# Patient Record
Sex: Female | Born: 1960 | Race: White | Hispanic: No | Marital: Single | Smoking: Former smoker
Health system: Southern US, Community
[De-identification: ages and names within clinical notes are randomized; demographics above are authoritative.]

## PROBLEM LIST (undated history)

## (undated) DIAGNOSIS — J439 Emphysema, unspecified: Secondary | ICD-10-CM

## (undated) DIAGNOSIS — D0511 Intraductal carcinoma in situ of right breast: Secondary | ICD-10-CM

## (undated) DIAGNOSIS — K5190 Ulcerative colitis, unspecified, without complications: Secondary | ICD-10-CM

## (undated) DIAGNOSIS — C50919 Malignant neoplasm of unspecified site of unspecified female breast: Secondary | ICD-10-CM

## (undated) DIAGNOSIS — M47816 Spondylosis without myelopathy or radiculopathy, lumbar region: Secondary | ICD-10-CM

## (undated) DIAGNOSIS — F419 Anxiety disorder, unspecified: Secondary | ICD-10-CM

## (undated) DIAGNOSIS — E039 Hypothyroidism, unspecified: Secondary | ICD-10-CM

## (undated) DIAGNOSIS — E7800 Pure hypercholesterolemia, unspecified: Secondary | ICD-10-CM

## (undated) DIAGNOSIS — F32A Depression, unspecified: Secondary | ICD-10-CM

## (undated) HISTORY — PX: SIMPLE MASTECTOMY: SHX6097

## (undated) HISTORY — DX: Emphysema, unspecified: J43.9

## (undated) HISTORY — DX: Spondylosis without myelopathy or radiculopathy, lumbar region: M47.816

## (undated) HISTORY — DX: Intraductal carcinoma in situ of right breast: D05.11

## (undated) HISTORY — PX: FOOT FOREIGN BODY REMOVAL: SUR1116

## (undated) HISTORY — DX: Pure hypercholesterolemia, unspecified: E78.00

## (undated) HISTORY — DX: Malignant neoplasm of unspecified site of unspecified female breast: C50.919

## (undated) HISTORY — PX: COLONOSCOPY: SHX174

## (undated) HISTORY — DX: Ulcerative colitis, unspecified, without complications: K51.90

## (undated) HISTORY — PX: FOOT FRACTURE SURGERY: SHX645

## (undated) HISTORY — PX: SIMPLE MASTECTOMY: SHX2409

---

## 1999-05-08 ENCOUNTER — Ambulatory Visit (HOSPITAL_BASED_OUTPATIENT_CLINIC_OR_DEPARTMENT_OTHER): Admission: RE | Admit: 1999-05-08 | Discharge: 1999-05-08 | Payer: Self-pay | Admitting: Orthopedic Surgery

## 2000-03-13 ENCOUNTER — Other Ambulatory Visit: Admission: RE | Admit: 2000-03-13 | Discharge: 2000-03-13 | Payer: Self-pay | Admitting: Emergency Medicine

## 2007-01-29 ENCOUNTER — Ambulatory Visit (HOSPITAL_COMMUNITY): Admission: RE | Admit: 2007-01-29 | Discharge: 2007-01-29 | Payer: Self-pay | Admitting: Gastroenterology

## 2007-02-12 ENCOUNTER — Encounter (HOSPITAL_COMMUNITY): Admission: RE | Admit: 2007-02-12 | Discharge: 2007-05-13 | Payer: Self-pay | Admitting: Gastroenterology

## 2008-01-13 ENCOUNTER — Ambulatory Visit (HOSPITAL_COMMUNITY): Admission: RE | Admit: 2008-01-13 | Discharge: 2008-01-13 | Payer: Self-pay | Admitting: Internal Medicine

## 2010-05-31 NOTE — Op Note (Signed)
[place]  Patient:    Carly Cook TIA                        MRN: [id number] Proc. Date: 05/08/99 Attending:  Josephine Cables., M.D.                           Operative Report  INDICATIONS:  The patient is a 50 year old female who dropped a knife lacerating her extensor hallicus longus to the great toe to the right foot and thought this represented enough of a disability certainly to justify irrigation and repair as an outpatient.  POSTOPERATIVE DIAGNOSIS:  Laceration of extensor hallucis longus tendon right foot.  PROCEDURE:  Repair of extensor hallucis longus tendon.  Irrigation and debridement.  SURGEON:  Josephine Cables., M.D.  ANESTHESIA:  General anesthesia.  TOURNIQUET TIME:  Approximately 30 minutes.  DESCRIPTION OF PROCEDURE:  Sterile prep and drape.  Initially a tourniquet was inflated for the exposure to allow the tendon ends to be reapproximated through the repair, the tourniquet was released.  It was inflated to 250 mmHg with more of  transverse incision directly over the EHL tendon and proximal to the metatarsal  phalangeal joint.  This was extended proximally and distally, moreso proximally to allow retrieval of the proximal end of the tendon.  The tendon cut was relatively transverse and clean.  It was repaired with a modifed Tagima suture 3-0 core suture with addition of a running 6-0 Prolene suture on the exterior of the tendon. Repaired the tendon nicely to a good resting level compared to the flexed position of the great toe preoperatively.  The wound was irrigated prior to this and subsequent to the repair, skin closure was effected with 4-0 nylon.  Marcaine without epinephrine infiltrated in the skin.  A lightly compressive sterile dressing and a short-leg cast applied with the foot at neutral to include the toes. DD:  05/08/99 TD:  05/08/99 Job: [id number]

## 2015-12-19 DIAGNOSIS — D0511 Intraductal carcinoma in situ of right breast: Secondary | ICD-10-CM | POA: Insufficient documentation

## 2015-12-19 DIAGNOSIS — Z170 Estrogen receptor positive status [ER+]: Secondary | ICD-10-CM | POA: Insufficient documentation

## 2017-03-06 ENCOUNTER — Encounter: Payer: Self-pay | Admitting: Gastroenterology

## 2017-03-13 ENCOUNTER — Other Ambulatory Visit (HOSPITAL_COMMUNITY): Payer: Self-pay | Admitting: *Deleted

## 2017-03-16 ENCOUNTER — Encounter (HOSPITAL_COMMUNITY): Payer: Self-pay

## 2017-03-16 ENCOUNTER — Ambulatory Visit (HOSPITAL_COMMUNITY)
Admission: RE | Admit: 2017-03-16 | Discharge: 2017-03-16 | Disposition: A | Discharge status: Home / Self Care | Payer: BC Managed Care – PPO | Source: Ambulatory Visit | Attending: Internal Medicine | Admitting: Internal Medicine

## 2017-03-16 DIAGNOSIS — M810 Age-related osteoporosis without current pathological fracture: Secondary | ICD-10-CM | POA: Insufficient documentation

## 2017-03-16 MED ORDER — ZOLEDRONIC ACID 5 MG/100ML IV SOLN
5.0000 mg | Freq: Once | INTRAVENOUS | Status: AC
  Administered 2017-03-16: 5 mg via INTRAVENOUS

## 2017-03-16 MED ORDER — ZOLEDRONIC ACID 5 MG/100ML IV SOLN
INTRAVENOUS | Status: AC
  Administered 2017-03-16: 5 mg via INTRAVENOUS
  Filled 2017-03-16: qty 100

## 2017-03-31 ENCOUNTER — Telehealth: Payer: Self-pay | Admitting: Oncology

## 2017-03-31 NOTE — Telephone Encounter (Signed)
Appt has been scheduled for the pt to see Dr. Jana Hakim on 4/8 at 4pm. Pt has her records on a CD and will bring it in on Friday. Location given and my direct number was given to the pt.

## 2017-04-19 NOTE — Progress Notes (Addendum)
[place]  Telephone:([phone]) [phone] Fax:([phone]) [phone]     ID: Carly Cook DOB: 1960-11-29  MR#: [id number]  GHW#:[id number]  Patient Care Team: Marton Redwood, MD as PCP - General (Internal Medicine) Eliud Polo, Virgie Dad, MD as Consulting Physician (Oncology) Garner Nash, MD (Hematology and Oncology) Edison Simon, MD as Referring Physician (General Surgery) Chauncey Cruel, MD OTHER MD:  CHIEF COMPLAINT: Ductal carcinoma in situ  CURRENT TREATMENT: Observation   HISTORY OF CURRENT ILLNESS: Carly Cook had routine screening mammography on 07/2013 in [place], [place] showing: Upper outer right breast calcifications.   Accordingly on 08/25/2013 she proceeded to biopsy of the right breast area in question. The pathology from this procedure at [facility] [phone]) showed: DCIS, intermediate nuclear grade with microcalcifications Prognostic indicators significant for: estrogen receptor, 94% positive and progesterone receptor, 48% positive. HER2 negative.  There were several areas of concern, and more biopsies were planned, but after a total of 4 biopsies she decided on mastectomy. She underwent a right mastectomy with sentinel node biopsy on 10/12/2013 with results showing: Multifocal ductal carcinoma in situ.  Surgical margins were negative.  All 5 sentinel lymph nodes were clear  On 05/17/2014, she underwent a Left Total Prophylactic Mastectomy ([id number]): Left Breast simple mastectomy: Fibrocystic changes with microcalcifications. One axillary lymph node with reactive lymphoid hyperplasia.  She was offered antiestrogens but decided against   The patient's subsequent history is as detailed below.  INTERVAL HISTORY: The patient was evaluated in the breast cancer clinic on April 20, 2017.  The actual pathology report from her September 2015 surgery was not available today but is being requested  REVIEW OF SYSTEMS: [name] had her surgery and workup in  [place] and [place] and only recently moved back to [place] She started with mammograms and [place] in her early 30's due to dense breasts and cysts to her bilateral breasts. She has a PMHx of ulcerative colitis that has been in remission for 5 years and she treats with lialda. She has had vertigo and burning mouth syndrome. She had a molar pulled around the time of her breast cancer diagnosis and she wonders if that was related.  She smoked for 33 years 1 PPD and quit in 2008 and was informed that she had early stage emphysema. She obtains a screening lung CT yearly with her last being in [place] in December 2018. She has had left foot and ankle repair with hardware due to crush and other injuries, There were no specific symptoms leading to the original mammogram, which was routinely scheduled. The patient denies unusual headaches, visual changes, nausea, vomiting, stiff neck, dizziness, or gait imbalance. There has been no cough, phlegm production, or pleurisy, no chest pain or pressure, and no change in bowel or bladder habits. The patient denies fever, rash, bleeding, unexplained fatigue or unexplained weight loss. A detailed review of systems was otherwise entirely negative.   PAST MEDICAL HISTORY: Past Medical History:  Diagnosis Date  . Breast cancer ([place])   . Ductal carcinoma in situ (DCIS) of right breast   . Emphysema of lung ([place])    Per patient, she is early stage emphysema due to a 33 year smoking history  . Hypercholesterolemia   . Ulcerative colitis ([place])     PAST SURGICAL HISTORY: Past Surgical History:  Procedure Laterality Date  . FOOT FOREIGN BODY REMOVAL Left   . FOOT FRACTURE SURGERY Right   . SIMPLE MASTECTOMY Right    5 sentinel lymph nodes removed  .  SIMPLE MASTECTOMY Left    1 sentinel lymph node removed    FAMILY HISTORY No family history on file. Her father died from MI in his sleep at age 68. Her mother is still alive as of April 2019, age 69.  The patient  has 0 brothers and 1 sister. She has a family hx of breast cancer in a maternal aunt and first cousin.  Patient has had genetic testing at [facility] in 2017 that was negative. She denies any family hx of ovarian cancer.   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 She is single and she lives alone with her cat. Her daughter Carly Cook is 38 years old and lives in [place] working as a Research scientist (physical sciences) in the hospital.  The patient doesn't have any grandchildren.      ADVANCED DIRECTIVES: She doesn't have a HCPOA at this time.    HEALTH MAINTENANCE: Social History   Tobacco Use  . Smoking status: Not on file  Substance Use Topics  . Alcohol use: Not on file  . Drug use: Not on file     Colonoscopy: UTD;She has an appointment with Dr. Silverio Decamp May 2019.   PAP: UTD, December 2018  Bone density: Due this year   Allergies  Allergen Reactions  . Norco [Hydrocodone-Acetaminophen] Other (See Comments)    Hallucinations, nausea  . Erythromycin Rash    Current Outpatient Medications  Medication Sig Dispense Refill  . Alpha-Lipoic Acid 600 MG CAPS Take by mouth.    Marland Kitchen CALCIUM CITRATE PO Take 800 mg by mouth daily.    . cyanocobalamin 1000 MCG tablet Take 1,500 mcg by mouth daily.    . Flaxseed, Linseed, (FLAXSEED OIL) 1000 MG CAPS Take by mouth.    Marland Kitchen MAGNESIUM BISGLYCINATE PO Take 400 mg by mouth daily.    . mesalamine (LIALDA) 1.2 g EC tablet Take by mouth.    Marland Kitchen UNABLE TO FIND Take by mouth.    Marland Kitchen UNABLE TO FIND Take 200 mcg by mouth daily. Med Name: 1-K2 ( MK-7)    . Zoledronic Acid (RECLAST IV) Inject into the vein.     No current facility-administered medications for this visit.     OBJECTIVE: Middle-aged white woman in no acute  distress  Vitals:   04/20/17 1556  BP: 113/60  Pulse: 78  Resp: 18  Temp: 98.6 F (37 C)  SpO2: 98%     Body mass index is 22.14 kg/m.   Wt Readings from Last 3 Encounters:  04/20/17 129 lb (58.5 kg)      ECOG FS:0 - Asymptomatic  Ocular: Sclerae unicteric, pupils round and equal Ear-nose-throat: Oropharynx clear and moist Lymphatic: No cervical or supraclavicular adenopathy Lungs no rales or rhonchi Heart regular rate and rhythm Abd soft, nontender, positive bowel sounds MSK no focal spinal tenderness, no joint edema Neuro: non-focal, well-oriented, appropriate affect Breasts: Status post bilateral mastectomies without reconstruction.  The incisions have healed nicely.  There is no evidence of chest wall recurrence.  Both axillae are benign.   LAB RESULTS:  CMP  No results found for: NA, K, CL, CO2, GLUCOSE, BUN, CREATININE, CALCIUM, PROT, ALBUMIN, AST, ALT, ALKPHOS, BILITOT, GFRNONAA, GFRAA  No results found for: TOTALPROTELP, ALBUMINELP, A1GS, A2GS, BETS, BETA2SER, GAMS,  MSPIKE, SPEI  No results found for: KPAFRELGTCHN, LAMBDASER, KAPLAMBRATIO  No results found for: WBC, NEUTROABS, HGB, HCT, MCV, PLT  @LASTCHEMISTRY @  No results found for: LABCA2  No components found for: [id number]  No results for input(s): INR in the last 168 hours.  No results found for: LABCA2  No results found for: QXI503  No results found for: UUE280  No results found for: KLK917  No results found for: CA2729  No components found for: HGQUANT  No results found for: CEA1 / No results found for: CEA1   No results found for: AFPTUMOR  No results found for: CHROMOGRNA  No results found for: PSA1  No visits with results within 3 Day(s) from this visit.  Latest known visit with results is:  No results found for any previous visit.    (this displays the last labs from the last 3 days)  No results found for: TOTALPROTELP, ALBUMINELP, A1GS, A2GS, BETS, BETA2SER, GAMS, MSPIKE,  SPEI (this displays SPEP labs)  No results found for: KPAFRELGTCHN, LAMBDASER, KAPLAMBRATIO (kappa/lambda light chains)  No results found for: HGBA, HGBA2QUANT, HGBFQUANT, HGBSQUAN (Hemoglobinopathy evaluation)   No results found for: LDH  No results found for: IRON, TIBC, IRONPCTSAT (Iron and TIBC)  No results found for: FERRITIN  Urinalysis No results found for: COLORURINE, APPEARANCEUR, LABSPEC, PHURINE, GLUCOSEU, HGBUR, BILIRUBINUR, KETONESUR, PROTEINUR, UROBILINOGEN, NITRITE, LEUKOCYTESUR   STUDIES: She had a DEXA scan on 08/05/2013 in [place], [place] with a T-score of -2.9 at Lumbar spine.  ELIGIBLE FOR AVAILABLE RESEARCH PROTOCOL: no  ASSESSMENT: 57 y.o.  woman status post right breast biopsy 08/25/2013 for ductal carcinoma in situ, estrogen and progesterone receptor positive, HER-2 negative  (1) status post right mastectomy and sentinel lymph node sampling 10/12/2013 for ductal carcinoma in situ, multifocal, with negative margins, all 5 sentinel nodes clear  (2) status post prophylactic left mastectomy 05/17/2014 showing no evidence of cancer, 1 sentinel lymph node clear  (3) genetics testing 2017 showed no deleterious mutations (per patient report).  PLAN: We spent the better part of today's hour-long appointment discussing the biology of her diagnosis and the specifics of her situation. Sundi understands that in noninvasive ductal carcinoma, also called ductal carcinoma in situ ("DCIS") the breast cancer cells remain trapped in the ducts were they started. They cannot travel to a vital organ. For that reason these cancers in themselves are not life-threatening.  If the whole breast is removed then all the ducts are removed and since the cancer cells are trapped in the ducts, the cure rate with mastectomy for noninvasive breast cancer approaches 100%.   In estrogen receptor positive cancers like this patient 's, anti-estrogens can also be considered. They can  reduce the risk of recurrence by one half.  However, the risk of local recurrence being in the 1% range, does not warrant any antiestrogen use.  In addition anti-estrogens could lower the risk of a new breast cancer developing in either breast, but in the absence of any breast tissue remaining there is no indication for antiestrogens in this case.  All Virgina needs in terms of breast cancer follow-up is a yearly physician chest wall exam.  I offered further follow-up here on a yearly basis until she completes her 5 years of "standard" observation, which was the original plan her surgeon suggested.  However she is comfortable being followed by her primary care physician, Dr. Brigitte Pulse, and so am I  Accordingly I am not making any further routine appointments for her here, but of  course I will be glad to see her again at any point in the future if and when the need arises.  Reilley Valentine, Virgie Dad, MD  04/20/17 4:59 PM Medical Oncology and Hematology [facility] [address] [place], [place] [address] Tel. [phone]    Fax. [phone]    This document serves as a record of services personally performed by Lurline Del, MD. It was created on his behalf by Steva Colder, a trained medical scribe. The creation of this record is based on the scribe's personal observations and the provider's statements to them.   I have reviewed the above documentation for accuracy and completeness, and I agree with the above.  ADDENDUM: Obtained the pathology report from [facility] pathology dated 10/12/2013, accession number [id number].  This shows a right simple mastectomy with ductal carcinoma in situ, non-comedo type, solid pattern, intermediate nuclear grade, with microcalcifications, and multifocal, largest diameter 2.0 cm, with all margins negative, and no invasive carcinoma identified.  A total of 4 lymph nodes were removed, all clear the closest margin was 1 cm from the deep margin.  Prior biopsy  showed ER to be 94% positive with strong staining intensity and progesterone 48% positive with strong staining intensity.  HER-2 was negative by immunohistochemistry with a score of 1+.

## 2017-04-20 ENCOUNTER — Encounter: Payer: Self-pay | Admitting: Oncology

## 2017-04-20 ENCOUNTER — Inpatient Hospital Stay: Payer: BC Managed Care – PPO | Attending: Oncology | Admitting: Oncology

## 2017-04-20 DIAGNOSIS — Z853 Personal history of malignant neoplasm of breast: Secondary | ICD-10-CM | POA: Insufficient documentation

## 2017-04-20 DIAGNOSIS — Z170 Estrogen receptor positive status [ER+]: Secondary | ICD-10-CM | POA: Insufficient documentation

## 2017-04-20 DIAGNOSIS — J439 Emphysema, unspecified: Secondary | ICD-10-CM | POA: Diagnosis not present

## 2017-04-20 DIAGNOSIS — K5100 Ulcerative (chronic) pancolitis without complications: Secondary | ICD-10-CM

## 2017-04-20 DIAGNOSIS — M818 Other osteoporosis without current pathological fracture: Secondary | ICD-10-CM

## 2017-04-20 DIAGNOSIS — M810 Age-related osteoporosis without current pathological fracture: Secondary | ICD-10-CM | POA: Insufficient documentation

## 2017-04-20 DIAGNOSIS — E7800 Pure hypercholesterolemia, unspecified: Secondary | ICD-10-CM | POA: Diagnosis not present

## 2017-04-20 DIAGNOSIS — Z9013 Acquired absence of bilateral breasts and nipples: Secondary | ICD-10-CM | POA: Insufficient documentation

## 2017-04-20 DIAGNOSIS — K5190 Ulcerative colitis, unspecified, without complications: Secondary | ICD-10-CM | POA: Insufficient documentation

## 2017-04-20 DIAGNOSIS — Z79899 Other long term (current) drug therapy: Secondary | ICD-10-CM | POA: Insufficient documentation

## 2017-04-21 ENCOUNTER — Telehealth: Payer: Self-pay | Admitting: Oncology

## 2017-04-21 NOTE — Telephone Encounter (Signed)
Per 4/8 no los

## 2017-05-26 ENCOUNTER — Ambulatory Visit: Payer: BC Managed Care – PPO | Admitting: Gastroenterology

## 2017-05-26 ENCOUNTER — Other Ambulatory Visit (INDEPENDENT_AMBULATORY_CARE_PROVIDER_SITE_OTHER): Payer: BC Managed Care – PPO

## 2017-05-26 ENCOUNTER — Encounter: Payer: Self-pay | Admitting: Gastroenterology

## 2017-05-26 VITALS — BP 118/64 | HR 72 | Ht 64.0 in | Wt 125.6 lb

## 2017-05-26 DIAGNOSIS — K5180 Other ulcerative colitis without complications: Secondary | ICD-10-CM | POA: Diagnosis not present

## 2017-05-26 DIAGNOSIS — K5100 Ulcerative (chronic) pancolitis without complications: Secondary | ICD-10-CM

## 2017-05-26 LAB — CBC WITH DIFFERENTIAL/PLATELET
BASOS PCT: 0.5 % (ref 0.0–3.0)
Basophils Absolute: 0 10*3/uL (ref 0.0–0.1)
EOS ABS: 0.1 10*3/uL (ref 0.0–0.7)
EOS PCT: 1.1 % (ref 0.0–5.0)
HEMATOCRIT: 40.7 % (ref 36.0–46.0)
HEMOGLOBIN: 13.5 g/dL (ref 12.0–15.0)
LYMPHS PCT: 29.5 % (ref 12.0–46.0)
Lymphs Abs: 1.5 10*3/uL (ref 0.7–4.0)
MCHC: 33.3 g/dL (ref 30.0–36.0)
MCV: 85.4 fl (ref 78.0–100.0)
Monocytes Absolute: 0.5 10*3/uL (ref 0.1–1.0)
Monocytes Relative: 9 % (ref 3.0–12.0)
Neutro Abs: 3 10*3/uL (ref 1.4–7.7)
Neutrophils Relative %: 59.9 % (ref 43.0–77.0)
Platelets: 292 10*3/uL (ref 150.0–400.0)
RBC: 4.77 Mil/uL (ref 3.87–5.11)
RDW: 13.6 % (ref 11.5–15.5)
WBC: 5 10*3/uL (ref 4.0–10.5)

## 2017-05-26 LAB — FOLATE: FOLATE: 11.2 ng/mL (ref >5.9)

## 2017-05-26 LAB — COMPREHENSIVE METABOLIC PANEL
ALBUMIN: 4.6 g/dL (ref 3.5–5.2)
ALK PHOS: 67 U/L (ref 39–117)
ALT: 11 U/L (ref 0–35)
AST: 13 U/L (ref 0–37)
BUN: 9 mg/dL (ref 6–23)
CALCIUM: 9.5 mg/dL (ref 8.4–10.5)
CHLORIDE: 103 meq/L (ref 96–112)
CO2: 28 mEq/L (ref 19–32)
Creatinine, Ser: 0.54 mg/dL (ref 0.40–1.20)
GFR: 122.73 mL/min (ref >60.00)
Glucose, Bld: 141 mg/dL — ABNORMAL HIGH (ref 70–99)
POTASSIUM: 3.8 meq/L (ref 3.5–5.1)
Sodium: 140 mEq/L (ref 135–145)
Total Bilirubin: 0.2 mg/dL (ref 0.2–1.2)
Total Protein: 7.5 g/dL (ref 6.0–8.3)

## 2017-05-26 LAB — VITAMIN B12: VITAMIN B 12: 980 pg/mL — AB (ref 211–911)

## 2017-05-26 LAB — HIGH SENSITIVITY CRP: CRP, High Sensitivity: 0.13 mg/L (ref 0.000–5.000)

## 2017-05-26 LAB — FERRITIN: Ferritin: 20.1 ng/mL (ref 10.0–291.0)

## 2017-05-26 MED ORDER — MESALAMINE 1.2 G PO TBEC: 2.4000 g | DELAYED_RELEASE_TABLET | Freq: Every day | ORAL | 3 refills | Status: DC

## 2017-05-26 NOTE — Patient Instructions (Signed)
Go to the basement for labs today  We have sent Lialda to your pharmacy  You have been scheduled for a colonoscopy. Please follow written instructions given to you at your visit today.  Please pick up your prep supplies at the pharmacy within the next 1-3 days. If you use inhalers (even only as needed), please bring them with you on the day of your procedure. Your physician has requested that you go to www.startemmi.com and enter the access code given to you at your visit today. This web site gives a general overview about your procedure. However, you should still follow specific instructions given to you by our office regarding your preparation for the procedure.  If you are age 29 or older, your body mass index should be between 23-30. Your Body mass index is 21.56 kg/m. If this is out of the aforementioned range listed, please consider follow up with your Primary Care Provider.  If you are age 69 or younger, your body mass index should be between 19-25. Your Body mass index is 21.56 kg/m. If this is out of the aformentioned range listed, please consider follow up with your Primary Care Provider.

## 2017-05-26 NOTE — Progress Notes (Signed)
Carly Cook    [id number]    [birth year]  Primary Care Physician:Shaw, Gwyndolyn Saxon, MD  Referring Physician: No referring provider defined for this encounter.  Chief complaint:  Ulcerative colitis  HPI: 78 yr F s/p bilateral mastectomy for multifocal carcinoma in situ in 2015, UC diagnosed in 1992 , last flare was in 2008, currently in remission on Lialda.  She was previously followed by Dr Earle Gell Colonoscopy January 15, 2006 by Dr. Wynetta Emery showed normal colonic mucosa biopsies, biopsies were obtained.   Colonoscopy December 21, 2006 on pathology had active pan colitis with exudate consistent with chronic active inflammatory bowel disease and superimposed C. Difficile Colonoscopy October 29, 2009 mild colitis in the distal sigmoid colon with random colon biopsies showed chronic active colitis with surface erosion in the rectosigmoid biopsies otherwise rest of the colon with no evidence of active inflammation Abdominal ultrasound November 19, 2010 showed minimally dilated common duct and mild fatty liver That is seen negative Colonoscopy January 02, 2012 in [place] showed abnormal vascularity and erythema in the sigmoid and descending colon.  A 4 mm hyperplastic polyp was removed from proximal rectum. Colonoscopy December 29, 2014 in [place] showed erythema and granularity in the descending and sigmoid colon, recommended repeat colonoscopy in 3 years  Denies any nausea, vomiting, abdominal pain, melena or bright red blood per rectum   Outpatient Encounter Medications as of 05/26/2017  Medication Sig  . Alpha-Lipoic Acid 600 MG CAPS Take by mouth.  . AMBULATORY NON FORMULARY MEDICATION Medication Name: hemp seed oil  . CALCIUM CITRATE PO Take 800 mg by mouth daily.  . cyanocobalamin 1000 MCG tablet Take 1,500 mcg by mouth daily.  . Flaxseed, Linseed, (FLAXSEED OIL) 1000 MG CAPS Take by mouth.  Marland Kitchen MAGNESIUM BISGLYCINATE PO Take 400 mg  by mouth daily.  . mesalamine (LIALDA) 1.2 g EC tablet Take by mouth.  Marland Kitchen UNABLE TO FIND Take by mouth.  Marland Kitchen UNABLE TO FIND Take 200 mcg by mouth daily. Med Name: 1-K2 ( MK-7)  . Zoledronic Acid (RECLAST IV) Inject into the vein.   No facility-administered encounter medications on file as of 05/26/2017.     Allergies as of 05/26/2017 - Review Complete 05/26/2017  Allergen Reaction Noted  . Norco [hydrocodone-acetaminophen] Other (See Comments) 03/16/2017  . Erythromycin Rash 03/16/2017    Past Medical History:  Diagnosis Date  . Breast cancer ([place])   . Ductal carcinoma in situ (DCIS) of right breast   . Emphysema of lung ([place])    Per patient, she is early stage emphysema due to a 33 year smoking history  . Hypercholesterolemia   . Ulcerative colitis [facility])     Past Surgical History:  Procedure Laterality Date  . FOOT FOREIGN BODY REMOVAL Left   . FOOT FRACTURE SURGERY Right   . SIMPLE MASTECTOMY Right    5 sentinel lymph nodes removed  . SIMPLE MASTECTOMY Left    1 sentinel lymph node removed    Family History  Problem Relation Age of Onset  . Diabetes Father     Social History   Socioeconomic History  . Marital status: Single    Spouse name: Not on file  . Number of children: Not on file  . Years of education: Not on file  . Highest education level: Not on file  Occupational History  . Occupation: Stage manager  Social Needs  . Financial resource strain: Not on file  .  Food insecurity:    Worry: Not on file    Inability: Not on file  . Transportation needs:    Medical: Not on file    Non-medical: Not on file  Tobacco Use  . Smoking status: Former Research scientist (life sciences)  . Smokeless tobacco: Never Used  Substance and Sexual Activity  . Alcohol use: Yes  . Drug use: Not on file  . Sexual activity: Not on file  Lifestyle  . Physical activity:    Days per week: Not on file    Minutes per session: Not on file  . Stress: Not on file  Relationships  . Social connections:      Talks on phone: Not on file    Gets together: Not on file    Attends religious service: Not on file    Active member of club or organization: Not on file    Attends meetings of clubs or organizations: Not on file    Relationship status: Not on file  . Intimate partner violence:    Fear of current or ex partner: Not on file    Emotionally abused: Not on file    Physically abused: Not on file    Forced sexual activity: Not on file  Other Topics Concern  . Not on file  Social History Narrative  . Not on file      Review of systems: Review of Systems  Constitutional: Negative for fever and chills.  HENT: Negative.   Eyes: Negative for blurred vision.  Respiratory: Negative for cough, shortness of breath and wheezing.   Cardiovascular: Negative for chest pain and palpitations.  Gastrointestinal: as per HPI Genitourinary: Negative for dysuria, urgency, frequency and hematuria.  Musculoskeletal: Negative for myalgias, back pain and joint pain.  Skin: Negative for itching and rash.  Neurological: Negative for dizziness, tremors, focal weakness, seizures and loss of consciousness.  Endo/Heme/Allergies: Positive for seasonal allergies.  Psychiatric/Behavioral: Negative for depression, suicidal ideas and hallucinations.  Positive for anxietyr All other systems reviewed and are negative.   Physical Exam: Vitals:   05/26/17 1329  BP: 118/64  Pulse: 72   Body mass index is 21.56 kg/m. Gen:      No acute distress HEENT:  EOMI, sclera anicteric Neck:     No masses; no thyromegaly Lungs:    Clear to auscultation bilaterally; normal respiratory effort CV:         Regular rate and rhythm; no murmurs Abd:      + bowel sounds; soft, non-tender; no palpable masses, no distension Ext:    No edema; adequate peripheral perfusion Skin:      Warm and dry; no rash Neuro: alert and oriented x 3 Psych: normal mood and affect  Data Reviewed:  Reviewed labs, radiology imaging, old  records and pertinent past GI work up   Assessment and Plan/Recommendations:  43 yr F with h/o ulcerative colitis diagnosed in 1992 on mesalamine in clinical remission Due for surveillance colonoscopy The risks and benefits as well as alternatives of endoscopic procedure(s) have been discussed and reviewed. All questions answered. The patient agrees to proceed. Check CBC, CMP, CRP, ferritin and B12 level  Greater than 50% of the time used for counseling as well as treatment plan and follow-up. She had multiple questions which were answered to her satisfaction  K. Denzil Magnuson , MD [phone]    CC: No ref. provider found

## 2017-05-28 ENCOUNTER — Encounter: Payer: Self-pay | Admitting: Gastroenterology

## 2017-06-24 ENCOUNTER — Encounter: Payer: Self-pay | Admitting: Gastroenterology

## 2017-07-03 ENCOUNTER — Encounter: Payer: BC Managed Care – PPO | Admitting: Gastroenterology

## 2017-07-07 ENCOUNTER — Ambulatory Visit (INDEPENDENT_AMBULATORY_CARE_PROVIDER_SITE_OTHER): Payer: BC Managed Care – PPO

## 2017-07-07 ENCOUNTER — Encounter: Payer: Self-pay | Admitting: Podiatry

## 2017-07-07 ENCOUNTER — Ambulatory Visit: Payer: BC Managed Care – PPO | Admitting: Podiatry

## 2017-07-07 VITALS — BP 105/64 | HR 83 | Resp 16

## 2017-07-07 DIAGNOSIS — M722 Plantar fascial fibromatosis: Secondary | ICD-10-CM | POA: Diagnosis not present

## 2017-07-07 MED ORDER — MELOXICAM 15 MG PO TABS: 15.0000 mg | ORAL_TABLET | Freq: Every day | ORAL | 1 refills | Status: DC

## 2017-07-07 MED ORDER — TRIAMCINOLONE ACETONIDE 10 MG/ML IJ SUSP
10.0000 mg | Freq: Once | INTRAMUSCULAR | Status: AC
  Administered 2017-07-07: 10 mg

## 2017-07-07 NOTE — Patient Instructions (Signed)

## 2017-07-08 DIAGNOSIS — M722 Plantar fascial fibromatosis: Secondary | ICD-10-CM | POA: Insufficient documentation

## 2017-07-08 NOTE — Progress Notes (Signed)
Subjective:   Patient ID: Carly Cook, female   DOB: 57 y.o.   MRN: [id number]   HPI 57 year old female presents the office today with concerns of pain to the bottom of the left heel which is been ongoing about 4 months.  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  She has pain and stiffness in the morning she first gets up.  She had an ankle sprain back in April which has done well but she continues to get pain in the bottom of her heel.  She is given naproxen as well as for exercises which helps some but does not limit all the symptoms.  She will occasionally get some localized burning to the bottom of her heel and sharp pain.   Review of Systems  All other systems reviewed and are negative.  Past Medical History:  Diagnosis Date  . Breast cancer ([place])   . Ductal carcinoma in situ (DCIS) of right breast   . Emphysema of lung ([place])    Per patient, she is early stage emphysema due to a 33 year smoking history  . Hypercholesterolemia   . Ulcerative colitis [facility])     Past Surgical History:  Procedure Laterality Date  . FOOT FOREIGN BODY REMOVAL Left   . FOOT FRACTURE SURGERY Right   . SIMPLE MASTECTOMY Right    5 sentinel lymph nodes removed  . SIMPLE MASTECTOMY Left    1 sentinel lymph node removed     Current Outpatient Medications:  .  Cholecalciferol (VITAMIN D PO), Take by mouth., Disp: , Rfl:  .  Cyanocobalamin (B-12 PO), Take by mouth., Disp: , Rfl:  .  escitalopram (LEXAPRO) 10 MG tablet, Take 10 mg by mouth daily., Disp: , Rfl:  .  Alpha-Lipoic Acid 600 MG CAPS, Take by mouth., Disp: , Rfl:  .  CALCIUM CITRATE PO, Take 800 mg by mouth daily., Disp: , Rfl:  .  MAGNESIUM BISGLYCINATE PO, Take 400 mg by mouth daily., Disp: , Rfl:  .  meloxicam (MOBIC) 15 MG tablet, Take 1 tablet (15 mg total) by mouth daily., Disp: 30 tablet, Rfl: 1 .  mesalamine (LIALDA) 1.2 g EC  tablet, Take 2 tablets (2.4 g total) by mouth daily with breakfast., Disp: 60 tablet, Rfl: 3 .  UNABLE TO FIND, Take 200 mcg by mouth daily. Med Name: 1-K2 ( MK-7), Disp: , Rfl:  .  Zoledronic Acid (RECLAST IV), Inject into the vein., Disp: , Rfl:   Allergies  Allergen Reactions  . Norco [Hydrocodone-Acetaminophen] Other (See Comments)    Hallucinations, nausea  . Erythromycin Rash          Objective:  Physical Exam  General: AAO x3, NAD  Dermatological: Skin is warm, dry and supple bilateral. Nails x 10 are well manicured; remaining integument appears unremarkable at this time. There are no open sores, no preulcerative lesions, no rash or signs of infection present.  Vascular: Dorsalis Pedis artery and Posterior Tibial artery pedal pulses are 2/4 bilateral with immedate capillary fill time.  There is no pain with calf compression, swelling, warmth, erythema.   Neruologic: Grossly intact via light touch bilateral.Protective threshold with Semmes Wienstein monofilament intact to all pedal sites bilateral.  Negative Tinel sign.  Musculoskeletal: Tenderness to palpation along the plantar medial tubercle of the calcaneus at the insertion of plantar fascia on the left foot. There is no pain  along the course of the plantar fascia within the arch of the foot. Plantar fascia appears to be intact. There is no pain with lateral compression of the calcaneus or pain with vibratory sensation. There is no pain along the course or insertion of the achilles tendon. No other areas of tenderness to bilateral lower extremities. Muscular strength 5/5 in all groups tested bilateral.  Gait: Unassisted, Nonantalgic.     Assessment:   57 year old female with left heel pain, plantar fasciitis     Plan:  -Treatment options discussed including all alternatives, risks, and complications -Etiology of symptoms were discussed -X-rays were obtained and reviewed with the patient.  No evidence of acute fracture  or stress fracture identified. -Steroid injections performed.  See procedure note below. -Plantar fascial brace dispensed -Discussed shoe modifications and orthotics.  We will check orthotic coverage for her. -Plantar fascial brace dispensed.  Procedure: Injection Tendon/Ligament Discussed alternatives, risks, complications and verbal consent was obtained.  Location: Left plantar fascia at the glabrous junction; medial approach. Skin Prep: Alcohol. Injectate: 0.5cc 0.5% marcaine plain, 0.5 cc 2% lidocaine plain and, 1 cc kenalog 10. Disposition: Patient tolerated procedure well. Injection site dressed with a band-aid.  Post-injection care was discussed and return precautions discussed.   Return in about 3 weeks (around 07/28/2017).  Trula Slade DPM

## 2017-07-28 ENCOUNTER — Other Ambulatory Visit: Payer: BC Managed Care – PPO | Admitting: Orthotics

## 2017-07-28 ENCOUNTER — Encounter: Payer: Self-pay | Admitting: Podiatry

## 2017-07-28 ENCOUNTER — Ambulatory Visit: Payer: BC Managed Care – PPO | Admitting: Podiatry

## 2017-07-28 DIAGNOSIS — M722 Plantar fascial fibromatosis: Secondary | ICD-10-CM

## 2017-08-04 NOTE — Progress Notes (Signed)
Subjective: 57 year old female presents the office today for follow-up evaluation of left foot pain and plantar fasciitis.  She said the injection did help quite a bit however she does not want to proceed with another one today.  She also presents today to get molded orthotics.  She has been doing stretching icing exercises. Denies any systemic complaints such as fevers, chills, nausea, vomiting. No acute changes since last appointment, and no other complaints at this time.   Objective: AAO x3, NAD DP/PT pulses palpable bilaterally, CRT less than 3 seconds There is improving her continued tenderness palpation on the plantar medial tubercle of the calcaneus at the insertion of plantar fashion the left foot.  Plantar fascia appears to be intact.  Achilles tendon appears to be intact.  There is no edema, erythema, increase in warmth.  No open lesions or pre-ulcerative lesions.  No pain with calf compression, swelling, warmth, erythema  Assessment: Left plantar fasciitis with improvement  Plan: -All treatment options discussed with the patient including all alternatives, risks, complications.  -She does not want to proceed with another steroid injection today.  I will have her continue with ice to the area as well as stretching, icing daily as well as a plantar fascial brace.  She has multiple orthotics today.  This was done by Liliane Channel. -Patient encouraged to call the office with any questions, concerns, change in symptoms.   Trula Slade DPM

## 2017-08-18 ENCOUNTER — Ambulatory Visit: Payer: BC Managed Care – PPO | Admitting: Orthotics

## 2017-08-18 DIAGNOSIS — M722 Plantar fascial fibromatosis: Secondary | ICD-10-CM

## 2017-08-18 NOTE — Progress Notes (Signed)
Patient came in today to pick up custom made foot orthotics.  The goals were accomplished and the patient reported no dissatisfaction with said orthotics.  Patient was advised of breakin period and how to report any issues. 

## 2017-12-03 ENCOUNTER — Encounter: Payer: Self-pay | Admitting: Physician Assistant

## 2017-12-03 ENCOUNTER — Other Ambulatory Visit (INDEPENDENT_AMBULATORY_CARE_PROVIDER_SITE_OTHER): Payer: BC Managed Care – PPO

## 2017-12-03 ENCOUNTER — Ambulatory Visit: Payer: BC Managed Care – PPO | Admitting: Physician Assistant

## 2017-12-03 VITALS — BP 100/60 | HR 59 | Ht 64.0 in | Wt 136.0 lb

## 2017-12-03 DIAGNOSIS — K51018 Ulcerative (chronic) pancolitis with other complication: Secondary | ICD-10-CM

## 2017-12-03 LAB — CBC WITH DIFFERENTIAL/PLATELET
BASOS ABS: 0 10*3/uL (ref 0.0–0.1)
BASOS PCT: 0.6 % (ref 0.0–3.0)
EOS ABS: 0.2 10*3/uL (ref 0.0–0.7)
Eosinophils Relative: 4.7 % (ref 0.0–5.0)
HCT: 37.2 % (ref 36.0–46.0)
HEMOGLOBIN: 12.4 g/dL (ref 12.0–15.0)
Lymphocytes Relative: 33.9 % (ref 12.0–46.0)
Lymphs Abs: 1.7 10*3/uL (ref 0.7–4.0)
MCHC: 33.3 g/dL (ref 30.0–36.0)
MCV: 84.7 fl (ref 78.0–100.0)
MONO ABS: 0.6 10*3/uL (ref 0.1–1.0)
Monocytes Relative: 11.3 % (ref 3.0–12.0)
Neutro Abs: 2.5 10*3/uL (ref 1.4–7.7)
Neutrophils Relative %: 49.5 % (ref 43.0–77.0)
Platelets: 282 10*3/uL (ref 150.0–400.0)
RBC: 4.39 Mil/uL (ref 3.87–5.11)
RDW: 13.1 % (ref 11.5–15.5)
WBC: 5 10*3/uL (ref 4.0–10.5)

## 2017-12-03 LAB — SEDIMENTATION RATE: SED RATE: 14 mm/h (ref 0–30)

## 2017-12-03 LAB — HIGH SENSITIVITY CRP: CRP HIGH SENSITIVITY: 0.28 mg/L (ref 0.000–5.000)

## 2017-12-03 MED ORDER — MESALAMINE 1000 MG RE SUPP: 1000.0000 mg | Freq: Every day | RECTAL | 0 refills | Status: DC

## 2017-12-03 NOTE — Progress Notes (Signed)
Chief Complaint: Ulcerative colitis flare  HPI:    [name] is a 57 year old female with a past medical history as listed below including UC diagnosed in 1992, last flare in 2008 maintained on Lialda 2.4 g/day, who presents to clinic today with complaints of an ulcerative colitis flare.    05/26/2017 office visit with Dr. Silverio Cook.  At that time she was establishing care and was well controlled.  She had a CBC, CMP, CRP, ferritin and B12 level.    Today, patient presents the clinic and explains that she feels as though she is at the beginning of an ulcerative colitis flare noting that she has increased frequency of looser stools between 6-7 times a day accompanied by some mucus and sometimes just mucus over the past week.  Also with some lower abdominal pain rated as a 2-3/10 which typically occurs more at night.  Also describes some associated lower back stiffness and hip pain over the past month or so.  Sometimes she tells me she feels as though she is going to have a bowel movement but does not and sometimes it is a long skinny stool.  This seems consistent with her last flare, although, it has been a long time since she has had any problems.  She continues Lialda 2.4 g/day.    Medical history significant for osteoporosis.  Patient tells me she would prefer to avoid Prednisone if possible.  Explains that she has used Canasa or Rowasa suppositories with success in the past.    Denies fever, chills, weight loss, blood in her stool, anorexia, nausea, vomiting, heartburn, reflux or symptoms that awaken her from sleep.     Recent GI history: Colonoscopy January 15, 2006 by Dr. Wynetta Cook showed normal colonic mucosa biopsies, biopsies were obtained.   Colonoscopy December 21, 2006 on pathology had active pan colitis with exudate consistent with chronic active inflammatory bowel disease and superimposed C. Difficile Colonoscopy October 29, 2009 mild colitis in the distal sigmoid colon with random colon  biopsies showed chronic active colitis with surface erosion in the rectosigmoid biopsies otherwise rest of the colon with no evidence of active inflammation Abdominal ultrasound November 19, 2010 showed minimally dilated common duct and mild fatty liver That is seen negative Colonoscopy January 02, 2012 in [place] showed abnormal vascularity and erythema in the sigmoid and descending colon.  A 4 mm hyperplastic polyp was removed from proximal rectum. Colonoscopy December 29, 2014 in [place] showed erythema and granularity in the descending and sigmoid colon, recommended repeat colonoscopy in 3 years  Past Medical History:  Diagnosis Date  . Breast cancer ([place])   . Ductal carcinoma in situ (DCIS) of right breast   . Emphysema of lung ([place])    Per patient, she is early stage emphysema due to a 33 year smoking history  . Hypercholesterolemia   . Ulcerative colitis [facility])     Past Surgical History:  Procedure Laterality Date  . FOOT FOREIGN BODY REMOVAL Left   . FOOT FRACTURE SURGERY Right   . SIMPLE MASTECTOMY Right    5 sentinel lymph nodes removed  . SIMPLE MASTECTOMY Left    1 sentinel lymph node removed    Current Outpatient Medications  Medication Sig Dispense Refill  . Alpha-Lipoic Acid 600 MG CAPS Take by mouth.    Marland Kitchen CALCIUM CITRATE PO Take 800 mg by mouth daily.    . Cholecalciferol (VITAMIN D PO) Take by mouth.    . Cyanocobalamin (B-12 PO) Take  by mouth.    . escitalopram (LEXAPRO) 10 MG tablet Take 10 mg by mouth daily.    Marland Kitchen MAGNESIUM BISGLYCINATE PO Take 400 mg by mouth daily.    . mesalamine (LIALDA) 1.2 g EC tablet Take 2 tablets (2.4 g total) by mouth daily with breakfast. 60 tablet 3  . UNABLE TO FIND Take 200 mcg by mouth daily. Med Name: 1-K2 ( MK-7)    . Zoledronic Acid (RECLAST IV) Inject into the vein.     No current facility-administered medications for this visit.     Allergies as of 12/03/2017 - Review Complete 12/03/2017   Allergen Reaction Noted  . Norco [hydrocodone-acetaminophen] Other (See Comments) 03/16/2017  . Erythromycin Rash 03/16/2017    Family History  Problem Relation Age of Onset  . Diabetes Father     Social History   Socioeconomic History  . Marital status: Single    Spouse name: Not on file  . Number of children: Not on file  . Years of education: Not on file  . Highest education level: Not on file  Occupational History  . Occupation: Stage manager  Social Needs  . Financial resource strain: Not on file  . Food insecurity:    Worry: Not on file    Inability: Not on file  . Transportation needs:    Medical: Not on file    Non-medical: Not on file  Tobacco Use  . Smoking status: Former Research scientist (life sciences)  . Smokeless tobacco: Never Used  Substance and Sexual Activity  . Alcohol use: Yes  . Drug use: Not on file  . Sexual activity: Not on file  Lifestyle  . Physical activity:    Days per week: Not on file    Minutes per session: Not on file  . Stress: Not on file  Relationships  . Social connections:    Talks on phone: Not on file    Gets together: Not on file    Attends religious service: Not on file    Active member of club or organization: Not on file    Attends meetings of clubs or organizations: Not on file    Relationship status: Not on file  . Intimate partner violence:    Fear of current or ex partner: Not on file    Emotionally abused: Not on file    Physically abused: Not on file    Forced sexual activity: Not on file  Other Topics Concern  . Not on file  Social History Narrative  . Not on file    Review of Systems:    Constitutional: No weight loss, fever or chills Cardiovascular: No chest pain Respiratory: No SOB  Gastrointestinal: See HPI and otherwise negative   Physical Exam:  Vital signs: BP 100/60   Pulse (!) 59   Ht 5' 4"  (1.626 m)   Wt 136 lb (61.7 kg)   BMI 23.34 kg/m   Constitutional:   Pleasant Caucasian female appears to be in NAD,  Well developed, Well nourished, alert and cooperative Respiratory: Respirations even and unlabored. Lungs clear to auscultation bilaterally.   No wheezes, crackles, or rhonchi.  Cardiovascular: Normal S1, S2. No MRG. Regular rate and rhythm. No peripheral edema, cyanosis or pallor.  Gastrointestinal:  Soft, nondistended, nontender. No rebound or guarding. Normal bowel sounds. No appreciable masses or hepatomegaly. Psychiatric:  Demonstrates good judgement and reason without abnormal affect or behaviors.  MOST RECENT LABS AND IMAGING: CBC    Component Value Date/Time   WBC 5.0 05/26/2017 1422  RBC 4.77 05/26/2017 1422   HGB 13.5 05/26/2017 1422   HCT 40.7 05/26/2017 1422   PLT 292.0 05/26/2017 1422   MCV 85.4 05/26/2017 1422   MCHC 33.3 05/26/2017 1422   RDW 13.6 05/26/2017 1422   LYMPHSABS 1.5 05/26/2017 1422   MONOABS 0.5 05/26/2017 1422   EOSABS 0.1 05/26/2017 1422   BASOSABS 0.0 05/26/2017 1422    CMP     Component Value Date/Time   NA 140 05/26/2017 1422   K 3.8 05/26/2017 1422   CL 103 05/26/2017 1422   CO2 28 05/26/2017 1422   GLUCOSE 141 (H) 05/26/2017 1422   BUN 9 05/26/2017 1422   CREATININE 0.54 05/26/2017 1422   CALCIUM 9.5 05/26/2017 1422   PROT 7.5 05/26/2017 1422   ALBUMIN 4.6 05/26/2017 1422   AST 13 05/26/2017 1422   ALT 11 05/26/2017 1422   ALKPHOS 67 05/26/2017 1422   BILITOT 0.2 05/26/2017 1422    Assessment: 1.  Ulcerative colitis flare: Describes increased frequency of stools, lower abdominal pain, mucus as well as hip pain and low back pain, last few colonoscopies have shown disease in the descending and sigmoid colon, patient declines Prednisone   Plan: 1.  Ordered CRP and ESR 2.  Prescribed Canasa suppositories 1019m nightly x4 weeks 3.  Discussed with patient that if she is not getting any better with just suppositories we may need to further discuss Prednisone vs Uceris. 4.  Patient wished to be rescheduled for her colonoscopy, but advised  her to wait until at least January at this point, we do not have schedule available.  She will be called [facility] 5.  Patient will call if she continues with any problems or concerns.  JEllouise Newer PA-C [facility] 12/03/2017, 3:22 PM  Cc: SMarton Redwood MD

## 2017-12-03 NOTE — Patient Instructions (Signed)
If you are age 59 or older, your body mass index should be between 23-30. Your Body mass index is 23.34 kg/m. If this is out of the aforementioned range listed, please consider follow up with your Primary Care Provider.  If you are age 45 or younger, your body mass index should be between 19-25. Your Body mass index is 23.34 kg/m. If this is out of the aformentioned range listed, please consider follow up with your Primary Care Provider.   Your provider has requested that you go to the basement level for lab work before leaving today. Press "B" on the elevator. The lab is located at the first door on the left as you exit the elevator.  We have sent the following medications to your pharmacy for you to pick up at your convenience: Canasa suppositories  We will contact you regarding colonoscopy with Dr. Silverio Decamp in January 2020 when the schedule becomes available.  Thank you for choosing me and [facility].  Ellouise Newer, PA-C

## 2017-12-04 NOTE — Progress Notes (Signed)
Will need to check C.diff to exclude infectious etiology, trigger for UC flare.   Reviewed and agree with documentation and assessment and plan. Damaris Hippo , MD

## 2017-12-18 ENCOUNTER — Encounter: Payer: Self-pay | Admitting: Gastroenterology

## 2018-01-02 ENCOUNTER — Other Ambulatory Visit: Payer: Self-pay

## 2018-01-04 MED ORDER — MESALAMINE 1000 MG RE SUPP: 1000.0000 mg | Freq: Every day | RECTAL | 0 refills | Status: DC

## 2018-01-15 ENCOUNTER — Other Ambulatory Visit: Payer: Self-pay | Admitting: Internal Medicine

## 2018-01-15 DIAGNOSIS — E785 Hyperlipidemia, unspecified: Secondary | ICD-10-CM

## 2018-01-15 DIAGNOSIS — I700 Atherosclerosis of aorta: Secondary | ICD-10-CM

## 2018-02-01 ENCOUNTER — Ambulatory Visit
Admission: RE | Admit: 2018-02-01 | Discharge: 2018-02-01 | Disposition: A | Discharge status: Home / Self Care | Payer: BC Managed Care – PPO | Source: Ambulatory Visit | Attending: Internal Medicine | Admitting: Internal Medicine

## 2018-02-01 DIAGNOSIS — E785 Hyperlipidemia, unspecified: Secondary | ICD-10-CM

## 2018-02-01 DIAGNOSIS — I700 Atherosclerosis of aorta: Secondary | ICD-10-CM

## 2018-02-12 ENCOUNTER — Encounter: Payer: BC Managed Care – PPO | Admitting: Gastroenterology

## 2018-03-02 ENCOUNTER — Encounter: Payer: Self-pay | Admitting: Gastroenterology

## 2018-03-02 ENCOUNTER — Ambulatory Visit (AMBULATORY_SURGERY_CENTER): Payer: Self-pay | Admitting: *Deleted

## 2018-03-02 VITALS — Ht 64.0 in | Wt 137.0 lb

## 2018-03-02 DIAGNOSIS — K51018 Ulcerative (chronic) pancolitis with other complication: Secondary | ICD-10-CM

## 2018-03-02 MED ORDER — PEG-KCL-NACL-NASULF-NA ASC-C 140 G PO SOLR: 1.0000 | ORAL | 0 refills | Status: DC

## 2018-03-02 NOTE — Progress Notes (Signed)
Patient denies any allergies to eggs or soy. Patient denies any problems with anesthesia/sedation. Patient denies any oxygen use at home. Patient denies taking any diet/weight loss medications or blood thinners. EMMI education offered, pt declined.  Patient states she has Plenvu sample at home. plenvu instructions given to pt.

## 2018-03-07 ENCOUNTER — Other Ambulatory Visit: Payer: Self-pay | Admitting: Gastroenterology

## 2018-03-08 ENCOUNTER — Ambulatory Visit (AMBULATORY_SURGERY_CENTER): Payer: BC Managed Care – PPO | Admitting: Gastroenterology

## 2018-03-08 ENCOUNTER — Encounter: Payer: Self-pay | Admitting: Gastroenterology

## 2018-03-08 VITALS — BP 106/69 | HR 89 | Temp 98.6°F | Resp 17 | Ht 64.0 in | Wt 136.0 lb

## 2018-03-08 DIAGNOSIS — K5100 Ulcerative (chronic) pancolitis without complications: Secondary | ICD-10-CM | POA: Diagnosis not present

## 2018-03-08 MED ORDER — SODIUM CHLORIDE 0.9 % IV SOLN: 500.0000 mL | Freq: Once | INTRAVENOUS | Status: DC

## 2018-03-08 MED ORDER — MESALAMINE 1.2 G PO TBEC: 2.4000 g | DELAYED_RELEASE_TABLET | Freq: Every day | ORAL | 2 refills | Status: DC

## 2018-03-08 NOTE — Progress Notes (Signed)
To PACU, VSS. Report to [email or web address] 

## 2018-03-08 NOTE — Op Note (Signed)
Carly Cook Patient Name: Carly Cook Procedure Date: 03/08/2018 1:22 PM MRN: [id number] Endoscopist: Mauri Pole , MD Age: 58 Referring MD:  Date of Birth: [birth year] Gender: Female Account #: [id number] Procedure:                Colonoscopy Indications:              High risk colon cancer surveillance: Ulcerative                            pancolitis of 8 (or more) years duration, Last                            colonoscopy: 2016 Medicines:                Monitored Anesthesia Care Procedure:                Pre-Anesthesia Assessment:                           - Prior to the procedure, a History and Physical                            was performed, and patient medications and                            allergies were reviewed. The patient's tolerance of                            previous anesthesia was also reviewed. The risks                            and benefits of the procedure and the sedation                            options and risks were discussed with the patient.                            All questions were answered, and informed consent                            was obtained. Prior Anticoagulants: The patient has                            taken no previous anticoagulant or antiplatelet                            agents. ASA Grade Assessment: II - A patient with                            mild systemic disease. After reviewing the risks                            and benefits, the patient was deemed in  satisfactory condition to undergo the procedure.                           - Prior to the procedure, a History and Physical                            was performed, and patient medications and                            allergies were reviewed. The patient's tolerance of                            previous anesthesia was also reviewed. The risks                            and benefits of the procedure and the sedation                            options and risks were discussed with the patient.                            All questions were answered, and informed consent                            was obtained. Prior Anticoagulants: The patient has                            taken no previous anticoagulant or antiplatelet                            agents. ASA Grade Assessment: II - A patient with                            mild systemic disease. After reviewing the risks                            and benefits, the patient was deemed in                            satisfactory condition to undergo the procedure.                           After obtaining informed consent, the colonoscope                            was passed under direct vision. Throughout the                            procedure, the patient's blood pressure, pulse, and                            oxygen saturations were monitored continuously. The  Colonoscope was introduced through the anus and                            advanced to the the cecum, identified by                            appendiceal orifice and ileocecal valve. The                            colonoscopy was performed without difficulty. The                            patient tolerated the procedure well. The quality                            of the bowel preparation was excellent. The                            ileocecal valve, appendiceal orifice, and rectum                            were photographed. Scope In: 1:26:36 PM Scope Out: 1:44:03 PM Scope Withdrawal Time: 0 hours 9 minutes 30 seconds  Total Procedure Duration: 0 hours 17 minutes 27 seconds  Findings:                 The perianal and digital rectal examinations were                            normal.                           Inflammation characterized by congestion (edema),                            erythema, loss of vascularity and mucus was found.                            The  sigmoid colon, the descending colon, the                            transverse colon, the ascending colon and the cecum                            were spared. This was graded as Mayo Score 1 (mild,                            with erythema, decreased vascular pattern, mild                            friability). Biopsies were obtained in the rectum,                            in the sigmoid colon, in the descending colon, in  the transverse colon, in the ascending colon and in                            the cecum with cold forceps for histology.                           Scattered small and large-mouthed diverticula were                            found in the sigmoid colon, descending colon,                            transverse colon and ascending colon.                           Non-bleeding internal hemorrhoids were found during                            retroflexion. The hemorrhoids were small. Complications:            No immediate complications. Estimated Blood Loss:     Estimated blood loss was minimal. Impression:               - Proctosigmoid ulcerative colitis. Inflammation                            was found. This was graded as Mayo Score 1 (mild                            disease). Biopsied.                           - Moderate diverticulosis in the sigmoid colon, in                            the descending colon, in the transverse colon and                            in the ascending colon.                           - Non-bleeding internal hemorrhoids.                           - Biopsies were obtained in the rectum, in the                            sigmoid colon, in the descending colon, in the                            transverse colon, in the ascending colon and in the                            cecum. Recommendation:           - Patient has a contact number available  for                            emergencies. The signs and symptoms of  potential                            delayed complications were discussed with the                            patient. Return to normal activities tomorrow.                            Written discharge instructions were provided to the                            patient.                           - Resume previous diet.                           - Continue present medications.                           - Await pathology results.                           - Repeat colonoscopy in 3 years for surveillance                            based on pathology results.                           - Increase Lialda to 2.4gm daily                           - Return to GI clinic at the next available                            appointment. Mauri Pole, MD 03/08/2018 1:53:23 PM This report has been signed electronically.

## 2018-03-08 NOTE — Progress Notes (Signed)
Pt's states no medical or surgical changes since previsit or office visit. 

## 2018-03-08 NOTE — Patient Instructions (Signed)
YOU HAD AN ENDOSCOPIC PROCEDURE TODAY AT [facility]:   Refer to the procedure report that was given to you for any specific questions about what was found during the examination.  If the procedure report does not answer your questions, please call your gastroenterologist to clarify.  If you requested that your care partner not be given the details of your procedure findings, then the procedure report has been included in a sealed envelope for you to review at your convenience later.  YOU SHOULD EXPECT: Some feelings of bloating in the abdomen. Passage of more gas than usual.  Walking can help get rid of the air that was put into your GI tract during the procedure and reduce the bloating. If you had a lower endoscopy (such as a colonoscopy or flexible sigmoidoscopy) you may notice spotting of blood in your stool or on the toilet paper. If you underwent a bowel prep for your procedure, you may not have a normal bowel movement for a few days.  Please Note:  You might notice some irritation and congestion in your nose or some drainage.  This is from the oxygen used during your procedure.  There is no need for concern and it should clear up in a day or so.  SYMPTOMS TO REPORT IMMEDIATELY:   Following lower endoscopy (colonoscopy or flexible sigmoidoscopy):  Excessive amounts of blood in the stool  Significant tenderness or worsening of abdominal pains  Swelling of the abdomen that is new, acute  Fever of 100F or higher   For urgent or emergent issues, a gastroenterologist can be reached at any hour by calling [phone].   DIET:  We do recommend a small meal at first, but then you may proceed to your regular diet.  Drink plenty of fluids but you should avoid alcoholic beverages for 24 hours.  MEDICATIONS: Continue present medications. Increase Lialda to 2.4 gm by mouth daily.  Follow up with Dr. Silverio Decamp in her office at next available appointment. Dr. Woodward Ku office nurse  will call you to schedule this appointment.  ACTIVITY:  You should plan to take it easy for the rest of today and you should NOT DRIVE or use heavy machinery until tomorrow (because of the sedation medicines used during the test).    FOLLOW UP: Our staff will call the number listed on your records the next business day following your procedure to check on you and address any questions or concerns that you may have regarding the information given to you following your procedure. If we do not reach you, we will leave a message.  However, if you are feeling well and you are not experiencing any problems, there is no need to return our call.  We will assume that you have returned to your regular daily activities without incident.  If any biopsies were taken you will be contacted by phone or by letter within the next 1-3 weeks.  Please call us at [phone] if you have not heard about the biopsies in 3 weeks.   Thank you for allowing [place] to provide for your healthcare needs today.  SIGNATURES/CONFIDENTIALITY: You and/or your care partner have signed paperwork which will be entered into your electronic medical record.  These signatures attest to the fact that that the information above on your After Visit Summary has been reviewed and is understood.  Full responsibility of the confidentiality of this discharge information lies with you and/or your care-partner.

## 2018-03-09 ENCOUNTER — Telehealth: Payer: Self-pay

## 2018-03-09 NOTE — Telephone Encounter (Signed)
Called patient to schedule her follow up appointment. No answer. Left the message on her voicemail.

## 2018-03-09 NOTE — Telephone Encounter (Signed)
No answer, left message to call if having any issues or concerns, B.Dickson Kostelnik RN 

## 2018-03-09 NOTE — Telephone Encounter (Signed)
Called [phone] and left a messaged we tried to reach pt for a follow up call. maw

## 2018-03-11 ENCOUNTER — Other Ambulatory Visit: Payer: Self-pay

## 2018-03-11 NOTE — Telephone Encounter (Signed)
Patient has not returned the call to schedule an appointment.  Now there are no openings on the present schedule. Recall entered. Schedule for 04/17/2018 not released.

## 2018-03-12 ENCOUNTER — Other Ambulatory Visit: Payer: Self-pay

## 2018-03-12 ENCOUNTER — Telehealth: Payer: Self-pay | Admitting: Gastroenterology

## 2018-03-12 MED ORDER — MESALAMINE 1.2 G PO TBEC: 4.8000 g | DELAYED_RELEASE_TABLET | Freq: Every day | ORAL | 6 refills | Status: DC

## 2018-03-12 NOTE — Telephone Encounter (Signed)
Pt called and stated she was advised to up the dosage of medication however when she picked up medication the prescription was the same.

## 2018-03-12 NOTE — Telephone Encounter (Signed)
It was an error, please send Rx for 4 capsules of Lialda 4.8gm daily. And follow up in office visit. Thanks

## 2018-03-12 NOTE — Telephone Encounter (Signed)
Dr Silverio Decamp please advise on this patient, she stated she went to pick up her rx increase and it was the same.... On colon report you put increase lialda to 2.4 daily which rx was sent In correctly for that amount... Patient states she was already taking 2 once daily and thought the increase was suppose to be for 4 once daily  Patient is confused

## 2018-03-12 NOTE — Telephone Encounter (Signed)
Resent in correct prescription and called patient to inform

## 2018-03-31 ENCOUNTER — Other Ambulatory Visit: Payer: Self-pay

## 2018-03-31 DIAGNOSIS — R197 Diarrhea, unspecified: Secondary | ICD-10-CM

## 2018-03-31 DIAGNOSIS — K5100 Ulcerative (chronic) pancolitis without complications: Secondary | ICD-10-CM

## 2018-03-31 MED ORDER — ROWASA 4 G RE KIT: PACK | RECTAL | 2 refills | Status: DC

## 2018-04-12 ENCOUNTER — Ambulatory Visit: Payer: BC Managed Care – PPO | Admitting: Gastroenterology

## 2018-04-19 ENCOUNTER — Other Ambulatory Visit: Payer: BC Managed Care – PPO

## 2018-04-19 DIAGNOSIS — R197 Diarrhea, unspecified: Secondary | ICD-10-CM

## 2018-04-19 DIAGNOSIS — K5100 Ulcerative (chronic) pancolitis without complications: Secondary | ICD-10-CM

## 2018-04-20 LAB — CLOSTRIDIUM DIFFICILE BY PCR: Toxigenic C. Difficile by PCR: NEGATIVE

## 2018-06-14 ENCOUNTER — Other Ambulatory Visit (INDEPENDENT_AMBULATORY_CARE_PROVIDER_SITE_OTHER): Payer: BC Managed Care – PPO

## 2018-06-14 ENCOUNTER — Other Ambulatory Visit: Payer: Self-pay

## 2018-06-14 DIAGNOSIS — K5100 Ulcerative (chronic) pancolitis without complications: Secondary | ICD-10-CM

## 2018-06-14 DIAGNOSIS — R197 Diarrhea, unspecified: Secondary | ICD-10-CM

## 2018-06-14 LAB — CBC WITH DIFFERENTIAL/PLATELET
Basophils Absolute: 0 10*3/uL (ref 0.0–0.1)
Basophils Relative: 0.4 % (ref 0.0–3.0)
Eosinophils Absolute: 0.2 10*3/uL (ref 0.0–0.7)
Eosinophils Relative: 3.9 % (ref 0.0–5.0)
HCT: 36.7 % (ref 36.0–46.0)
Hemoglobin: 12.5 g/dL (ref 12.0–15.0)
Lymphocytes Relative: 36.5 % (ref 12.0–46.0)
Lymphs Abs: 1.8 10*3/uL (ref 0.7–4.0)
MCHC: 34.1 g/dL (ref 30.0–36.0)
MCV: 84.7 fl (ref 78.0–100.0)
Monocytes Absolute: 0.6 10*3/uL (ref 0.1–1.0)
Monocytes Relative: 12.7 % — ABNORMAL HIGH (ref 3.0–12.0)
Neutro Abs: 2.3 10*3/uL (ref 1.4–7.7)
Neutrophils Relative %: 46.5 % (ref 43.0–77.0)
Platelets: 244 10*3/uL (ref 150.0–400.0)
RBC: 4.33 Mil/uL (ref 3.87–5.11)
RDW: 13.2 % (ref 11.5–15.5)
WBC: 4.9 10*3/uL (ref 4.0–10.5)

## 2018-06-14 LAB — SEDIMENTATION RATE: Sed Rate: 24 mm/hr (ref 0–30)

## 2018-06-15 LAB — COMPREHENSIVE METABOLIC PANEL
ALT: 26 U/L (ref 0–35)
AST: 20 U/L (ref 0–37)
Albumin: 4.2 g/dL (ref 3.5–5.2)
Alkaline Phosphatase: 69 U/L (ref 39–117)
BUN: 13 mg/dL (ref 6–23)
CO2: 22 mEq/L (ref 19–32)
Calcium: 9.2 mg/dL (ref 8.4–10.5)
Chloride: 106 mEq/L (ref 96–112)
Creatinine, Ser: 0.54 mg/dL (ref 0.40–1.20)
GFR: 115.06 mL/min (ref >60.00)
Glucose, Bld: 99 mg/dL (ref 70–99)
Potassium: 4.1 mEq/L (ref 3.5–5.1)
Sodium: 140 mEq/L (ref 135–145)
Total Bilirubin: 0.2 mg/dL (ref 0.2–1.2)
Total Protein: 6.9 g/dL (ref 6.0–8.3)

## 2018-06-15 LAB — HIGH SENSITIVITY CRP: CRP, High Sensitivity: 1.51 mg/L (ref 0.000–5.000)

## 2018-06-16 ENCOUNTER — Other Ambulatory Visit: Payer: Self-pay

## 2018-06-16 MED ORDER — BUDESONIDE ER 9 MG PO TB24: 1.0000 | ORAL_TABLET | Freq: Every day | ORAL | 3 refills | Status: DC

## 2018-07-01 ENCOUNTER — Ambulatory Visit: Payer: BC Managed Care – PPO | Admitting: Gastroenterology

## 2018-07-13 ENCOUNTER — Encounter: Payer: Self-pay | Admitting: General Surgery

## 2018-07-13 ENCOUNTER — Telehealth: Payer: Self-pay | Admitting: General Surgery

## 2018-07-13 NOTE — Telephone Encounter (Signed)
Updated patient information for Doximity visit on 07/14/2018

## 2018-07-14 ENCOUNTER — Encounter: Payer: Self-pay | Admitting: Gastroenterology

## 2018-07-14 ENCOUNTER — Ambulatory Visit (INDEPENDENT_AMBULATORY_CARE_PROVIDER_SITE_OTHER): Payer: BC Managed Care – PPO | Admitting: Gastroenterology

## 2018-07-14 VITALS — Ht 64.0 in | Wt 135.0 lb

## 2018-07-14 DIAGNOSIS — K51311 Ulcerative (chronic) rectosigmoiditis with rectal bleeding: Secondary | ICD-10-CM

## 2018-07-14 DIAGNOSIS — K580 Irritable bowel syndrome with diarrhea: Secondary | ICD-10-CM

## 2018-07-14 MED ORDER — BUDESONIDE ER 9 MG PO TB24: 1.0000 | ORAL_TABLET | Freq: Every day | ORAL | 3 refills | Status: DC

## 2018-07-14 NOTE — Progress Notes (Signed)
Carly Cook    [id number]    February 10, 1960  Primary Care Physician:Shaw, Gwyndolyn Saxon, MD  Referring Physician: Marton Redwood, MD [address] [place],  [place] [address]  This service was provided via audio and video telemedicine (Doximity) due to [place] 19 pandemic.  Patient location: Home Provider location: Office Used 2 patient identifiers to confirm the correct person. Explained the limitations in evaluation and management via telemedicine. Patient is aware of potential medical charges for this visit.  Patient consented to this virtual visit.  The persons participating in this telemedicine service were myself and the patient   Chief complaint: Ulcerative colitis HPI: 58 year old female status post bilateral mastectomy, ulcerative colitis diagnosed in 1992 for follow-up visit.  She was in remission on Lialda but started having worsening symptoms suggestive of UC flare with left lower quadrant abdominal pain, increased bowel frequency and rectal bleeding.  C. difficile negative. She has been doing Rowasa enema daily at bedtime for past 4 to 5 weeks and also started on Uceris. Since starting Uceris she is not having any rectal bleeding but continues to have increased bowel frequency with semi-formed bowel movements 4-5 times a day. She also has flatulence and left lower quadrant pressure/discomfort, is not sure if the enema is causing it.  Denies any fever, chills, weight loss, nausea or vomiting.  Colonoscopy March 08, 2018 showed mild left-sided colitis, pancolonic diverticulosis and hemorrhoids.  Lialda was increased from 2.4 to 4.8 g daily at the time.  GI Hx: She was previously followed by Dr Earle Gell Colonoscopy January 15, 2006 by Dr. Wynetta Emery showed normal colonic mucosa biopsies, biopsies were obtained.   Colonoscopy December 21, 2006 on pathology had active pan colitis with exudate consistent with chronic active inflammatory bowel disease and superimposed  C. Difficile Colonoscopy October 29, 2009 mild colitis in the distal sigmoid colon with random colon biopsies showed chronic active colitis with surface erosion in the rectosigmoid biopsies otherwise rest of the colon with no evidence of active inflammation Abdominal ultrasound November 19, 2010 showed minimally dilated common duct and mild fatty liver That is seen negative Colonoscopy January 02, 2012 in [place] showed abnormal vascularity and erythema in the sigmoid and descending colon.  A 4 mm hyperplastic polyp was removed from proximal rectum. Colonoscopy December 29, 2014 in [place] showed erythema and granularity in the descending and sigmoid colon, recommended repeat colonoscopy in 3 years   Outpatient Encounter Medications as of 07/14/2018  Medication Sig  . Budesonide ER (UCERIS) 9 MG TB24 Take 1 tablet by mouth daily.  Marland Kitchen CALCIUM CITRATE PO Take 800 mg by mouth daily.  . Cholecalciferol (VITAMIN D PO) Take by mouth.  . Cyanocobalamin (B-12 PO) Take by mouth.  Marland Kitchen MAGNESIUM BISGLYCINATE PO Take 400 mg by mouth daily.  . mesalamine (LIALDA) 1.2 g EC tablet Take 4 tablets (4.8 g total) by mouth daily with breakfast.  . Mesalamine-Cleanser (ROWASA) 4 g KIT Use every day at bedtime per rectum  . rosuvastatin (CRESTOR) 10 MG tablet   . UNABLE TO FIND Take 200 mcg by mouth daily. Med Name: 1-K2 ( MK-7)  . Zoledronic Acid (RECLAST IV) Inject into the vein.  . Alpha-Lipoic Acid 600 MG CAPS Take by mouth.   Facility-Administered Encounter Medications as of 07/14/2018  Medication  . 0.9 %  sodium chloride infusion    Allergies as of 07/14/2018 - Review Complete 07/14/2018  Allergen Reaction Noted  . Norco [  hydrocodone-acetaminophen] Other (See Comments) 03/16/2017  . Erythromycin Rash 03/16/2017    Past Medical History:  Diagnosis Date  . Breast cancer ([place]) [id number]   sx only  . Ductal carcinoma in situ (DCIS) of right breast   . Emphysema of lung  ([place])    Per patient, she is early stage emphysema due to a 33 year smoking history  . Hypercholesterolemia   . Ulcerative colitis [facility])     Past Surgical History:  Procedure Laterality Date  . COLONOSCOPY  last 12/2014  . FOOT FOREIGN BODY REMOVAL Left   . FOOT FRACTURE SURGERY Right   . SIMPLE MASTECTOMY Right    5 sentinel lymph nodes removed  . SIMPLE MASTECTOMY Left    1 sentinel lymph node removed    Family History  Problem Relation Age of Onset  . Diabetes Father   . Osteoporosis Mother   . High blood pressure Mother   . High blood pressure Sister   . Colon cancer Neg Hx   . Colon polyps Neg Hx   . Esophageal cancer Neg Hx   . Prostate cancer Neg Hx   . Rectal cancer Neg Hx     Social History   Socioeconomic History  . Marital status: Single    Spouse name: Not on file  . Number of children: Not on file  . Years of education: Not on file  . Highest education level: Not on file  Occupational History  . Occupation: Stage manager  Social Needs  . Financial resource strain: Not on file  . Food insecurity    Worry: Not on file    Inability: Not on file  . Transportation needs    Medical: Not on file    Non-medical: Not on file  Tobacco Use  . Smoking status: Former Smoker    Types: Cigarettes    Quit date: 03/02/2006    Years since quitting: 12.3  . Smokeless tobacco: Never Used  Substance and Sexual Activity  . Alcohol use: Yes    Comment: occ  . Drug use: Not Currently  . Sexual activity: Not on file  Lifestyle  . Physical activity    Days per week: Not on file    Minutes per session: Not on file  . Stress: Not on file  Relationships  . Social Herbalist on phone: Not on file    Gets together: Not on file    Attends religious service: Not on file    Active member of club or organization: Not on file    Attends meetings of clubs or organizations: Not on file    Relationship status: Not on file  . Intimate partner violence    Fear  of current or ex partner: Not on file    Emotionally abused: Not on file    Physically abused: Not on file    Forced sexual activity: Not on file  Other Topics Concern  . Not on file  Social History Narrative  . Not on file      Review of systems: Review of Systems as per HPI All other systems reviewed and are negative.   Physical Exam: Vitals were not taken and physical exam was not performed during this virtual visit.  Data Reviewed:  Reviewed labs, radiology imaging, old records and pertinent past GI work up   Assessment and Plan/Recommendations:  58 year old female with history of ulcerative colitis initially diagnosed in 1982, acute UC flare C. difficile negative  Continue Uceris 9  mg daily for additional 4 weeks, please send a refill  Continue Lialda 4.8 g daily  Stop Rowasa enema  If continues to have persistent symptoms will consider starting prednisone and will also need to consider Biologics to achieve remission  Follow-up virtual visit in 3 to 4 weeks     K. Denzil Magnuson , MD   CC: Marton Redwood, MD

## 2018-07-14 NOTE — Patient Instructions (Addendum)
Continue Uceris 9 mg daily for additional 4 weeks, please send a refill  Continue Lialda 4.8 g daily  Stop Rowasa enema  Follow-up virtual visit in 3 to 4 weeks   I appreciate the  opportunity to care for you  Thank You   Harl Bowie , MD

## 2018-07-23 ENCOUNTER — Other Ambulatory Visit: Payer: Self-pay | Admitting: Gastroenterology

## 2018-07-23 ENCOUNTER — Other Ambulatory Visit: Payer: Self-pay

## 2018-07-23 MED ORDER — MESALAMINE 1000 MG RE SUPP: 1000.0000 mg | Freq: Every day | RECTAL | 1 refills | Status: DC

## 2018-07-28 ENCOUNTER — Telehealth: Payer: BC Managed Care – PPO | Admitting: Family

## 2018-07-28 DIAGNOSIS — Z20822 Contact with and (suspected) exposure to covid-19: Secondary | ICD-10-CM

## 2018-07-28 MED ORDER — ALBUTEROL SULFATE HFA 108 (90 BASE) MCG/ACT IN AERS: 2.0000 | INHALATION_SPRAY | Freq: Four times a day (QID) | RESPIRATORY_TRACT | 0 refills | Status: DC | PRN

## 2018-07-28 MED ORDER — BENZONATATE 100 MG PO CAPS: 100.0000 mg | ORAL_CAPSULE | Freq: Three times a day (TID) | ORAL | 0 refills | Status: DC | PRN

## 2018-07-28 NOTE — Progress Notes (Signed)
E-Visit for Corona Virus Screening   Your current symptoms could be consistent with the coronavirus.  Many health care providers can now test patients at their office but not all are.  [place] has multiple testing sites. For information on our COVID testing locations and hours go to [email or web address]  Please quarantine yourself while awaiting your test results.    COVID-19 is a respiratory illness with symptoms that are similar to the flu. Symptoms are typically mild to moderate, but there have been cases of severe illness and death due to the virus. The following symptoms may appear 2-14 days after exposure: . Fever . Cough . Shortness of breath or difficulty breathing . Chills . Repeated shaking with chills . Muscle pain . Headache . Sore throat . New loss of taste or smell . Fatigue . Congestion or runny nose . Nausea or vomiting . Diarrhea  It is vitally important that if you feel that you have an infection such as this virus or any other virus that you stay home and away from places where you may spread it to others.  You should self-quarantine for 14 days if you have symptoms that could potentially be coronavirus or have been in close contact a with a person diagnosed with COVID-19 within the last 2 weeks. You should avoid contact with people age 53 and older.   You should wear a mask or cloth face covering over your nose and mouth if you must be around other people or animals, including pets (even at home). Try to stay at least 6 feet away from other people. This will protect the people around you.  You can use medication such as A prescription cough medication called Tessalon Perles 100 mg. You may take 1-2 capsules every 8 hours as needed for cough and A prescription inhaler called Albuterol MDI 90 mcg /actuation 2 puffs every 4 hours as needed for shortness of breath, wheezing, cough  You may also take acetaminophen (Tylenol) as needed for  fever.   Reduce your risk of any infection by using the same precautions used for avoiding the common cold or flu:  Marland Kitchen Wash your hands often with soap and warm water for at least 20 seconds.  If soap and water are not readily available, use an alcohol-based hand sanitizer with at least 60% alcohol.  . If coughing or sneezing, cover your mouth and nose by coughing or sneezing into the elbow areas of your shirt or coat, into a tissue or into your sleeve (not your hands). . Avoid shaking hands with others and consider head nods or verbal greetings only. . Avoid touching your eyes, nose, or mouth with unwashed hands.  . Avoid close contact with people who are sick. . Avoid places or events with large numbers of people in one location, like concerts or sporting events. . Carefully consider travel plans you have or are making. . If you are planning any travel outside or inside the [place], visit the CDC's Travelers' Health webpage for the latest health notices. . If you have some symptoms but not all symptoms, continue to monitor at home and seek medical attention if your symptoms worsen. . If you are having a medical emergency, call 911.  HOME CARE . Only take medications as instructed by your medical team. . Drink plenty of fluids and get plenty of rest. . A steam or ultrasonic humidifier can help if you have congestion.   GET HELP RIGHT AWAY IF YOU HAVE EMERGENCY WARNING SIGNS** FOR  COVID-19. If you or someone is showing any of these signs seek emergency medical care immediately. Call 911 or proceed to your closest emergency facility if: . You develop worsening high fever. . Trouble breathing . Bluish lips or face . Persistent pain or pressure in the chest . New confusion . Inability to wake or stay awake . You cough up blood. . Your symptoms become more severe  **This list is not all possible symptoms. Contact your medical provider for any symptoms that are sever or concerning to you.   MAKE  SURE YOU   Understand these instructions.  Will watch your condition.  Will get help right away if you are not doing well or get worse.  Your e-visit answers were reviewed by a board certified advanced clinical practitioner to complete your personal care plan.  Depending on the condition, your plan could have included both over the counter or prescription medications.  If there is a problem please reply once you have received a response from your provider.  Your safety is important to [place].  If you have drug allergies check your prescription carefully.    You can use MyChart to ask questions about today's visit, request a non-urgent call back, or ask for a work or school excuse for 24 hours related to this e-Visit. If it has been greater than 24 hours you will need to follow up with your provider, or enter a new e-Visit to address those concerns. You will get an e-mail in the next two days asking about your experience.  I hope that your e-visit has been valuable and will speed your recovery. Thank you for using e-visits.

## 2018-07-29 ENCOUNTER — Encounter (INDEPENDENT_AMBULATORY_CARE_PROVIDER_SITE_OTHER): Payer: Self-pay

## 2018-07-30 ENCOUNTER — Encounter (INDEPENDENT_AMBULATORY_CARE_PROVIDER_SITE_OTHER): Payer: Self-pay

## 2018-07-31 ENCOUNTER — Encounter (INDEPENDENT_AMBULATORY_CARE_PROVIDER_SITE_OTHER): Payer: Self-pay

## 2018-08-01 ENCOUNTER — Encounter (INDEPENDENT_AMBULATORY_CARE_PROVIDER_SITE_OTHER): Payer: Self-pay

## 2018-08-02 ENCOUNTER — Encounter (INDEPENDENT_AMBULATORY_CARE_PROVIDER_SITE_OTHER): Payer: Self-pay

## 2018-08-03 ENCOUNTER — Encounter (INDEPENDENT_AMBULATORY_CARE_PROVIDER_SITE_OTHER): Payer: Self-pay

## 2018-08-04 ENCOUNTER — Encounter (INDEPENDENT_AMBULATORY_CARE_PROVIDER_SITE_OTHER): Payer: Self-pay

## 2018-08-05 ENCOUNTER — Encounter (INDEPENDENT_AMBULATORY_CARE_PROVIDER_SITE_OTHER): Payer: Self-pay

## 2018-08-06 ENCOUNTER — Encounter (INDEPENDENT_AMBULATORY_CARE_PROVIDER_SITE_OTHER): Payer: Self-pay

## 2018-08-07 ENCOUNTER — Encounter (INDEPENDENT_AMBULATORY_CARE_PROVIDER_SITE_OTHER): Payer: Self-pay

## 2018-08-08 ENCOUNTER — Encounter (INDEPENDENT_AMBULATORY_CARE_PROVIDER_SITE_OTHER): Payer: Self-pay

## 2018-08-09 ENCOUNTER — Encounter (INDEPENDENT_AMBULATORY_CARE_PROVIDER_SITE_OTHER): Payer: Self-pay

## 2018-08-10 ENCOUNTER — Encounter (INDEPENDENT_AMBULATORY_CARE_PROVIDER_SITE_OTHER): Payer: Self-pay

## 2018-08-17 ENCOUNTER — Encounter: Payer: Self-pay | Admitting: Gastroenterology

## 2018-08-25 ENCOUNTER — Ambulatory Visit: Payer: BC Managed Care – PPO | Admitting: Gastroenterology

## 2018-08-31 ENCOUNTER — Encounter: Payer: Self-pay | Admitting: Gastroenterology

## 2018-08-31 ENCOUNTER — Ambulatory Visit: Payer: BC Managed Care – PPO | Admitting: Gastroenterology

## 2018-08-31 ENCOUNTER — Other Ambulatory Visit (INDEPENDENT_AMBULATORY_CARE_PROVIDER_SITE_OTHER): Payer: BC Managed Care – PPO

## 2018-08-31 ENCOUNTER — Other Ambulatory Visit: Payer: Self-pay

## 2018-08-31 VITALS — BP 110/70 | HR 88 | Temp 98.4°F | Ht 64.0 in | Wt 138.4 lb

## 2018-08-31 DIAGNOSIS — K51811 Other ulcerative colitis with rectal bleeding: Secondary | ICD-10-CM

## 2018-08-31 DIAGNOSIS — K51918 Ulcerative colitis, unspecified with other complication: Secondary | ICD-10-CM

## 2018-08-31 DIAGNOSIS — D594 Other nonautoimmune hemolytic anemias: Secondary | ICD-10-CM

## 2018-08-31 LAB — C-REACTIVE PROTEIN: CRP: <1 mg/dL (ref 0.5–20.0)

## 2018-08-31 LAB — SEDIMENTATION RATE: Sed Rate: 17 mm/hr (ref 0–30)

## 2018-08-31 MED ORDER — BUDESONIDE ER 9 MG PO TB24: 1.0000 | ORAL_TABLET | Freq: Every day | ORAL | 2 refills | Status: DC

## 2018-08-31 NOTE — Progress Notes (Signed)
Carly Cook    [id number]    May 30, 1960  Primary Care Physician:Shaw, Gwyndolyn Saxon, MD  Referring Physician: Marton Redwood, MD [address] [place],  [place] [address]   Chief complaint:  UC  HPI: 58 yr F with history of breast CA status post bilateral mastectomy, UC since 1992 here for follow-up visit  Last office visit July 14, 2018.  She is currently on budesonide for past 10 weeks for UC flare with rectal bleeding, diarrhea, tenesmus and fecal urgency Somewhat improved but continues to have symptoms if she stops using mesalamine suppository or enema, is currently alternating Canasa and Rowasa every other day. She is also on Lialda 4.8 g daily On average she is having 4-5 semi-formed bowel movements daily with occasional nocturnal symptoms and rectal bleeding if she does not use Canasa suppository or Rowasa enema  GI Hx: Colonoscopy March 08, 2018 showed mild left-sided colitis, pancolonic diverticulosis and hemorrhoids.  Lialda was increased from 2.4 to 4.8 g daily at the time.   She was previously followed by Dr Earle Gell  She was on Remicade previously, but discontinued due to possible systemic lupus like reaction/allergic reaction.  She was treated with prednisone on and off for intermittent UC flares  Colonoscopy January 15, 2006 by Dr. Wynetta Emery showed normal colonic mucosa biopsies, biopsies were obtained.  Colonoscopy December 21, 2006 on pathology had activepancolitis with exudate consistent with chronic active inflammatory bowel diseaseand superimposed C. Difficile  Colonoscopy October 29, 2009 mild colitis in the distal sigmoid colon with random colon biopsies showed chronic active colitis with surface erosion in the rectosigmoid biopsies otherwise rest of the colon with no evidence of active inflammation  Abdominal ultrasound November 19, 2010 showed minimally dilated common duct and mild fatty liver  Colonoscopy January 02, 2012 in  [place] showed abnormal vascularity and erythema in the sigmoid and descending colon. A 4 mm hyperplastic polyp was removed from proximal rectum.  Colonoscopy December 29, 2014 in [place] showed erythema and granularity in the descending and sigmoid colon, recommended repeat colonoscopy in 3 years   Outpatient Encounter Medications as of 08/31/2018  Medication Sig  . albuterol (VENTOLIN HFA) 108 (90 Base) MCG/ACT inhaler Inhale 2 puffs into the lungs every 6 (six) hours as needed for wheezing or shortness of breath.  . benzonatate (TESSALON PERLES) 100 MG capsule Take 1 capsule (100 mg total) by mouth 3 (three) times daily as needed.  . Budesonide ER (UCERIS) 9 MG TB24 Take 1 tablet by mouth daily.  Marland Kitchen CALCIUM CITRATE PO Take 800 mg by mouth daily.  . Cholecalciferol (VITAMIN D PO) Take 5,000 Units by mouth daily.   . Cyanocobalamin (B-12 PO) Take by mouth.  Marland Kitchen MAGNESIUM BISGLYCINATE PO Take 400 mg by mouth daily.  . mesalamine (CANASA) 1000 MG suppository Place 1 suppository (1,000 mg total) rectally at bedtime. (Patient taking differently: Place 1,000 mg rectally at bedtime. Alternated with rowasa)  . mesalamine (LIALDA) 1.2 g EC tablet Take 4 tablets (4.8 g total) by mouth daily with breakfast.  . Mesalamine-Cleanser (ROWASA) 4 g KIT Use every day at bedtime per rectum (Patient taking differently: Use every day at bedtime per rectum-alternate with canasa)  . rosuvastatin (CRESTOR) 10 MG tablet   . UNABLE TO FIND Take 200 mcg by mouth daily. Med Name: 1-K2 ( MK-7)  . Zoledronic Acid (RECLAST IV) Inject into the vein.  . Alpha-Lipoic Acid 600 MG CAPS Take by  mouth.  . [DISCONTINUED] 0.9 %  sodium chloride infusion    No facility-administered encounter medications on file as of 08/31/2018.     Allergies as of 08/31/2018 - Review Complete 08/31/2018  Allergen Reaction Noted  . Norco [hydrocodone-acetaminophen] Other (See Comments) 03/16/2017  . Remicade  [infliximab]  08/31/2018  . Erythromycin Rash 03/16/2017    Past Medical History:  Diagnosis Date  . Breast cancer ([place]) [id number]   sx only  . Ductal carcinoma in situ (DCIS) of right breast   . Emphysema of lung ([place])    Per patient, she is early stage emphysema due to a 33 year smoking history  . Hypercholesterolemia   . Ulcerative colitis [facility])     Past Surgical History:  Procedure Laterality Date  . COLONOSCOPY  last 12/2014  . FOOT FOREIGN BODY REMOVAL Right   . FOOT FRACTURE SURGERY Left   . SIMPLE MASTECTOMY Right    5 sentinel lymph nodes removed  . SIMPLE MASTECTOMY Left    1 sentinel lymph node removed    Family History  Problem Relation Age of Onset  . Diabetes Father   . Osteoporosis Mother   . High blood pressure Mother   . High blood pressure Sister   . Colon cancer Neg Hx   . Colon polyps Neg Hx   . Esophageal cancer Neg Hx   . Prostate cancer Neg Hx   . Rectal cancer Neg Hx     Social History   Socioeconomic History  . Marital status: Single    Spouse name: Not on file  . Number of children: Not on file  . Years of education: Not on file  . Highest education level: Not on file  Occupational History  . Occupation: Stage manager  Social Needs  . Financial resource strain: Not on file  . Food insecurity    Worry: Not on file    Inability: Not on file  . Transportation needs    Medical: Not on file    Non-medical: Not on file  Tobacco Use  . Smoking status: Former Smoker    Types: Cigarettes    Quit date: 03/02/2006    Years since quitting: 12.5  . Smokeless tobacco: Never Used  Substance and Sexual Activity  . Alcohol use: Yes    Comment: occ  . Drug use: Not Currently  . Sexual activity: Not on file  Lifestyle  . Physical activity    Days per week: Not on file    Minutes per session: Not on file  . Stress: Not on file  Relationships  . Social Herbalist on phone: Not on file    Gets together: Not on file    Attends  religious service: Not on file    Active member of club or organization: Not on file    Attends meetings of clubs or organizations: Not on file    Relationship status: Not on file  . Intimate partner violence    Fear of current or ex partner: Not on file    Emotionally abused: Not on file    Physically abused: Not on file    Forced sexual activity: Not on file  Other Topics Concern  . Not on file  Social History Narrative  . Not on file      Review of systems: Review of Systems  Constitutional: Negative for fever and chills.  HENT: Negative.   Eyes: Negative for blurred vision.  Respiratory: Negative for cough, shortness of  breath and wheezing.   Cardiovascular: Negative for chest pain and palpitations.  Gastrointestinal: as per HPI Genitourinary: Negative for dysuria, urgency, frequency and hematuria.  Musculoskeletal: Negative for myalgias, back pain and joint pain.  Skin: Negative for itching and rash.  Neurological: Negative for dizziness, tremors, focal weakness, seizures and loss of consciousness.  Endo/Heme/Allergies: Negative Psychiatric/Behavioral: Negative for depression, suicidal ideas and hallucinations.  All other systems reviewed and are negative.   Physical Exam: Vitals:   08/31/18 1506  BP: 110/70  Pulse: 88  Temp: 98.4 F (36.9 C)   Body mass index is 23.76 kg/m. Gen:      No acute distress HEENT:  EOMI, sclera anicteric Neck:     No masses; no thyromegaly Lungs:    Clear to auscultation bilaterally; normal respiratory effort CV:         Regular rate and rhythm; no murmurs Abd:      + bowel sounds; soft, non-tender; no palpable masses, no distension Ext:    No edema; adequate peripheral perfusion Skin:      Warm and dry; no rash Neuro: alert and oriented x 3 Psych: normal mood and affect  Data Reviewed:  Reviewed labs, radiology imaging, old records and pertinent past GI work up   Assessment and Plan/Recommendations:  58 year old female  with history of breast CA status post bilateral mastectomy, ulcerative colitis initially diagnosed in 1992 with UC flare since June 2020 Persistent symptoms despite budesonide 9 mg daily, Lialda 4.8 g and still having to use Canasa suppositories and Rowasa enema She will need Biologics given she has failed above-mentioned therapy She had reaction to Remicade in the past Will get insurance approval to initiate Entyvio infusion as soon as possible  Continue budesonide 9 mg daily along with Lialda 4.8 g daily Continue rectal mesalamine suppository and enema as needed  Check QuantiFERON TB, CRP and ESR  Return in 6 to 8 weeks or soon  25 minutes was spent face-to-face with the patient. Greater than 50% of the time used for counseling as well as treatment plan and follow-up. She had multiple questions which were answered to her satisfaction  K. Denzil Magnuson , MD    CC: Marton Redwood, MD

## 2018-08-31 NOTE — Patient Instructions (Addendum)
Go to the basement for labs today  We will contact you when to start Entyvio Infusion   We will refill Budesonide   I appreciate the  opportunity to care for you  Thank You   Harl Bowie , MD

## 2018-09-01 ENCOUNTER — Other Ambulatory Visit: Payer: Self-pay

## 2018-09-01 DIAGNOSIS — K51918 Ulcerative colitis, unspecified with other complication: Secondary | ICD-10-CM

## 2018-09-02 LAB — QUANTIFERON-TB GOLD PLUS
Mitogen-NIL: >10 IU/mL
NIL: 0.01 IU/mL
QuantiFERON-TB Gold Plus: NEGATIVE
TB1-NIL: 0.01 IU/mL
TB2-NIL: 0.01 IU/mL

## 2018-09-03 ENCOUNTER — Other Ambulatory Visit: Payer: Self-pay

## 2018-09-03 MED ORDER — ENTYVIO 300 MG IV SOLR: INTRAVENOUS | 11 refills | Status: DC

## 2018-10-05 ENCOUNTER — Other Ambulatory Visit: Payer: Self-pay | Admitting: *Deleted

## 2018-10-05 MED ORDER — MESALAMINE 1000 MG RE SUPP: 1000.0000 mg | Freq: Every day | RECTAL | 1 refills | Status: DC

## 2018-10-28 ENCOUNTER — Other Ambulatory Visit: Payer: Self-pay | Admitting: Gastroenterology

## 2018-11-08 ENCOUNTER — Other Ambulatory Visit: Payer: Self-pay

## 2018-11-08 MED ORDER — BUDESONIDE 3 MG PO CPEP: ORAL_CAPSULE | ORAL | 0 refills | Status: DC

## 2018-11-26 ENCOUNTER — Other Ambulatory Visit: Payer: Self-pay | Admitting: Gastroenterology

## 2018-11-29 ENCOUNTER — Other Ambulatory Visit: Payer: Self-pay | Admitting: Gastroenterology

## 2018-12-08 ENCOUNTER — Telehealth: Payer: Self-pay | Admitting: *Deleted

## 2018-12-08 MED ORDER — MESALAMINE 1.2 G PO TBEC: 4.8000 g | DELAYED_RELEASE_TABLET | Freq: Every day | ORAL | 2 refills | Status: DC

## 2018-12-08 NOTE — Telephone Encounter (Signed)
Sent in refills of Lialda for patient as requested

## 2018-12-20 ENCOUNTER — Encounter: Payer: Self-pay | Admitting: Gastroenterology

## 2018-12-20 ENCOUNTER — Ambulatory Visit: Payer: BC Managed Care – PPO | Admitting: Gastroenterology

## 2018-12-20 ENCOUNTER — Other Ambulatory Visit (INDEPENDENT_AMBULATORY_CARE_PROVIDER_SITE_OTHER): Payer: BC Managed Care – PPO

## 2018-12-20 VITALS — BP 102/68 | HR 84 | Temp 98.2°F | Ht 64.0 in | Wt 134.0 lb

## 2018-12-20 DIAGNOSIS — Z9225 Personal history of immunosupression therapy: Secondary | ICD-10-CM | POA: Diagnosis not present

## 2018-12-20 DIAGNOSIS — K5100 Ulcerative (chronic) pancolitis without complications: Secondary | ICD-10-CM | POA: Diagnosis not present

## 2018-12-20 LAB — COMPREHENSIVE METABOLIC PANEL
ALT: 27 U/L (ref 0–35)
AST: 20 U/L (ref 0–37)
Albumin: 4.5 g/dL (ref 3.5–5.2)
Alkaline Phosphatase: 72 U/L (ref 39–117)
BUN: 8 mg/dL (ref 6–23)
CO2: 28 mEq/L (ref 19–32)
Calcium: 9.6 mg/dL (ref 8.4–10.5)
Chloride: 102 mEq/L (ref 96–112)
Creatinine, Ser: 0.54 mg/dL (ref 0.40–1.20)
GFR: 114.86 mL/min (ref >60.00)
Glucose, Bld: 109 mg/dL — ABNORMAL HIGH (ref 70–99)
Potassium: 3.9 mEq/L (ref 3.5–5.1)
Sodium: 137 mEq/L (ref 135–145)
Total Bilirubin: 0.3 mg/dL (ref 0.2–1.2)
Total Protein: 7.2 g/dL (ref 6.0–8.3)

## 2018-12-20 LAB — CBC WITH DIFFERENTIAL/PLATELET
Basophils Absolute: 0 10*3/uL (ref 0.0–0.1)
Basophils Relative: 0.5 % (ref 0.0–3.0)
Eosinophils Absolute: 0.1 10*3/uL (ref 0.0–0.7)
Eosinophils Relative: 2.7 % (ref 0.0–5.0)
HCT: 39.1 % (ref 36.0–46.0)
Hemoglobin: 12.7 g/dL (ref 12.0–15.0)
Lymphocytes Relative: 38.3 % (ref 12.0–46.0)
Lymphs Abs: 2.2 10*3/uL (ref 0.7–4.0)
MCHC: 32.5 g/dL (ref 30.0–36.0)
MCV: 87.1 fl (ref 78.0–100.0)
Monocytes Absolute: 0.5 10*3/uL (ref 0.1–1.0)
Monocytes Relative: 8.9 % (ref 3.0–12.0)
Neutro Abs: 2.8 10*3/uL (ref 1.4–7.7)
Neutrophils Relative %: 49.6 % (ref 43.0–77.0)
Platelets: 275 10*3/uL (ref 150.0–400.0)
RBC: 4.49 Mil/uL (ref 3.87–5.11)
RDW: 13.4 % (ref 11.5–15.5)
WBC: 5.6 10*3/uL (ref 4.0–10.5)

## 2018-12-20 MED ORDER — MESALAMINE 1.2 G PO TBEC: 4.8000 g | DELAYED_RELEASE_TABLET | Freq: Every day | ORAL | 2 refills | Status: DC

## 2018-12-20 NOTE — Patient Instructions (Signed)
We have given you a FLU shot today  Go to the basement today for labs  Follow up in 6 months  If you are age 60 or older, your body mass index should be between 23-30. Your Body mass index is 23 kg/m. If this is out of the aforementioned range listed, please consider follow up with your Primary Care Provider.  If you are age 32 or younger, your body mass index should be between 19-25. Your Body mass index is 23 kg/m. If this is out of the aformentioned range listed, please consider follow up with your Primary Care Provider.    I appreciate the  opportunity to care for you  Thank You   Harl Bowie , MD

## 2018-12-20 NOTE — Progress Notes (Signed)
Carly Cook    [id number]    02-23-1960  Primary Care Physician:Shaw, Gwyndolyn Saxon, MD  Referring Physician: Marton Redwood, MD [address] [place],  [place] [address]   Chief complaint:  Ulcerative colitis  HPI:  58 yr F with history of breast cancer s/p bilateral mastectomy, ulcerative colitis diagnosed in 1992 here for follow-up visit. Last office visit August 31, 2018 She completed induction dose of Entyvio and is due for 8-week maintenance dose January 12, 2019 She is off budesonide for past 2 weeks.  She was on budesonide on and off for past 6 months along with Rowasa enema alternating with Canasa suppository with inadequate control of symptoms prior to starting Entyvio. Since she started the induction dose of Entyvio she feels much better overall with decreased bowel frequency.  On average she is having 2-3 formed bowel movements daily with no mucus or blood.  Denies any abdominal pain, decreased appetite or weight loss.  No nausea, vomiting, dysphagia or odynophagia.  She is tolerating Entyvio well overall.  Relevant GI history:  Colonoscopy 03/08/2018: showed mild left-sided colitis, pancolonic diverticulosis and hemorrhoids.  She was previously followed by Dr Earle Gell  She was on Remicade previously, but discontinued due to possible systemic lupus like reaction/allergic reaction.  She was treated with prednisone on and off for intermittent UC flares  Colonoscopy January 15, 2006 by Dr. Wynetta Emery showed normal colonic mucosa biopsies, biopsies were obtained.  Colonoscopy December 21, 2006 on pathology had activepancolitis with exudate consistent with chronic active inflammatory bowel diseaseand superimposed C. Difficile  Colonoscopy October 29, 2009 mild colitis in the distal sigmoid colon with random colon biopsies showed chronic active colitis with surface erosion in the rectosigmoid biopsies otherwise rest of the colon with no evidence of active  inflammation  Abdominal ultrasound November 19, 2010 showed minimally dilated common duct and mild fatty liver  Colonoscopy January 02, 2012 in [place] showed abnormal vascularity and erythema in the sigmoid and descending colon. A 4 mm hyperplastic polyp was removed from proximal rectum.  Colonoscopy December 29, 2014 in [place] showed erythema and granularity in the descending and sigmoid colon, recommended repeat colonoscopy in 3 years  Outpatient Encounter Medications as of 12/20/2018  Medication Sig  . CALCIUM CITRATE PO Take 800 mg by mouth daily.  . Cholecalciferol (VITAMIN D PO) Take 5,000 Units by mouth daily.   . Cyanocobalamin (B-12 PO) Take by mouth.  Marland Kitchen MAGNESIUM BISGLYCINATE PO Take 400 mg by mouth daily.  . mesalamine (LIALDA) 1.2 g EC tablet Take 4 tablets (4.8 g total) by mouth daily with breakfast.  . Mesalamine-Cleanser (ROWASA) 4 g KIT Use every day at bedtime per rectum (Patient taking differently: Use every day at bedtime per rectum-alternate with canasa)  . rosuvastatin (CRESTOR) 10 MG tablet   . UNABLE TO FIND Take 200 mcg by mouth daily. Med Name: 1-K2 ( MK-7)  . vedolizumab (ENTYVIO) 300 MG injection Infuse week 0, 2, 6 then every 8 weeks  . VITAMIN K PO Take by mouth.  . Zoledronic Acid (RECLAST IV) Inject into the vein.  . Alpha-Lipoic Acid 600 MG CAPS Take by mouth.  . [DISCONTINUED] albuterol (VENTOLIN HFA) 108 (90 Base) MCG/ACT inhaler Inhale 2 puffs into the lungs every 6 (six) hours as needed for wheezing or shortness of breath.  . [DISCONTINUED] benzonatate (TESSALON PERLES) 100 MG capsule Take 1 capsule (100 mg total) by mouth 3 (three) times daily  as needed.  . [DISCONTINUED] budesonide (ENTOCORT EC) 3 MG 24 hr capsule Take 2 capsules daily for 14 days then take 1 capsule daily for 14 days  . [DISCONTINUED] Budesonide ER 9 MG TB24 TAKE 1 TABLET BY MOUTH EVERY DAY  . [DISCONTINUED] mesalamine (CANASA) 1000 MG  suppository Place 1 suppository (1,000 mg total) rectally at bedtime.   No facility-administered encounter medications on file as of 12/20/2018.     Allergies as of 12/20/2018 - Review Complete 08/31/2018  Allergen Reaction Noted  . Norco [hydrocodone-acetaminophen] Other (See Comments) 03/16/2017  . Remicade [infliximab]  08/31/2018  . Erythromycin Rash 03/16/2017    Past Medical History:  Diagnosis Date  . Breast cancer ([place]) [id number]   sx only  . Ductal carcinoma in situ (DCIS) of right breast   . Emphysema of lung ([place])    Per patient, she is early stage emphysema due to a 33 year smoking history  . Hypercholesterolemia   . Ulcerative colitis [facility])     Past Surgical History:  Procedure Laterality Date  . COLONOSCOPY  last 12/2014  . FOOT FOREIGN BODY REMOVAL Right   . FOOT FRACTURE SURGERY Left   . SIMPLE MASTECTOMY Right    5 sentinel lymph nodes removed  . SIMPLE MASTECTOMY Left    1 sentinel lymph node removed    Family History  Problem Relation Age of Onset  . Diabetes Father   . Osteoporosis Mother   . High blood pressure Mother   . High blood pressure Sister   . Colon cancer Neg Hx   . Colon polyps Neg Hx   . Esophageal cancer Neg Hx   . Prostate cancer Neg Hx   . Rectal cancer Neg Hx   . Pancreatic cancer Neg Hx     Social History   Socioeconomic History  . Marital status: Single    Spouse name: Not on file  . Number of children: Not on file  . Years of education: Not on file  . Highest education level: Not on file  Occupational History  . Occupation: Stage manager  Social Needs  . Financial resource strain: Not on file  . Food insecurity    Worry: Not on file    Inability: Not on file  . Transportation needs    Medical: Not on file    Non-medical: Not on file  Tobacco Use  . Smoking status: Former Smoker    Types: Cigarettes    Quit date: 03/02/2006    Years since quitting: 12.8  . Smokeless tobacco: Never Used  Substance and Sexual  Activity  . Alcohol use: Yes    Comment: occ.  . Drug use: Never  . Sexual activity: Not on file  Lifestyle  . Physical activity    Days per week: Not on file    Minutes per session: Not on file  . Stress: Not on file  Relationships  . Social Herbalist on phone: Not on file    Gets together: Not on file    Attends religious service: Not on file    Active member of club or organization: Not on file    Attends meetings of clubs or organizations: Not on file    Relationship status: Not on file  . Intimate partner violence    Fear of current or ex partner: Not on file    Emotionally abused: Not on file    Physically abused: Not on file    Forced sexual activity: Not  on file  Other Topics Concern  . Not on file  Social History Narrative  . Not on file      Review of systems: Review of Systems  Constitutional: Negative for fever and chills.  HENT: Positive for postnasal drip and sore throat Eyes: Negative for blurred vision.  Respiratory: Negative for cough, shortness of breath and wheezing.   Cardiovascular: Negative for chest pain and palpitations.  Gastrointestinal: as per HPI Genitourinary: Negative for dysuria, urgency, frequency and hematuria.  Musculoskeletal: Negative for myalgias and joint pain.  Positive for back pain Skin: Negative for itching and rash.  Neurological: Negative for dizziness, tremors, focal weakness, seizures and loss of consciousness.  Endo/Heme/Allergies: Positive for seasonal allergies.  Psychiatric/Behavioral: Negative for depression, suicidal ideas and hallucinations.  All other systems reviewed and are negative.   Physical Exam: Vitals:   12/20/18 1502  BP: 102/68  Pulse: 84  Temp: 98.2 F (36.8 C)   Body mass index is 23 kg/m. Gen:      No acute distress HEENT:  EOMI, sclera anicteric Neck:     No masses; no thyromegaly Lungs:    Clear to auscultation bilaterally; normal respiratory effort CV:         Regular rate  and rhythm; no murmurs Abd:      + bowel sounds; soft, non-tender; no palpable masses, no distension Ext:    No edema; adequate peripheral perfusion Skin:      Warm and dry; no rash Neuro: alert and oriented x 3 Psych: normal mood and affect  Data Reviewed:  Reviewed labs, radiology imaging, old records and pertinent past GI work up   Assessment and Plan/Recommendations:  58 year old female with history breast cancer s/p bilateral mastectomy, ulcerative colitis initially diagnosed in 1992, recent UC flare June 2020. Started on [facility] September 2020 with significant improvement of symptoms and appears to be in clinical remission Continue maintenance dose every 8 weeks Continue Lialda 4.8 g daily Check CBC, CMP and CRP  Okay to discontinue budesonide, Canasa suppositories and Rowasa enema  Colorectal cancer screening: Due for surveillance colonoscopy February 2023  TB QuantiFERON negative August 2020  Flu vaccine given today  Return in 6 months or sooner if needed  25 minutes was spent face-to-face with the patient. Greater than 50% of the time used for counseling as well as treatment plan and follow-up. She had multiple questions which were answered to her satisfaction  K. Denzil Magnuson , MD    CC: Marton Redwood, MD

## 2019-02-05 ENCOUNTER — Other Ambulatory Visit: Payer: Self-pay | Admitting: Internal Medicine

## 2019-02-05 DIAGNOSIS — F17200 Nicotine dependence, unspecified, uncomplicated: Secondary | ICD-10-CM

## 2019-02-11 ENCOUNTER — Other Ambulatory Visit: Payer: Self-pay | Admitting: Internal Medicine

## 2019-02-14 ENCOUNTER — Ambulatory Visit
Admission: RE | Admit: 2019-02-14 | Discharge: 2019-02-14 | Disposition: A | Discharge status: Home / Self Care | Payer: No Typology Code available for payment source | Source: Ambulatory Visit | Attending: Internal Medicine | Admitting: Internal Medicine

## 2019-02-14 DIAGNOSIS — F17200 Nicotine dependence, unspecified, uncomplicated: Secondary | ICD-10-CM

## 2019-09-26 ENCOUNTER — Telehealth: Payer: Self-pay | Admitting: Gastroenterology

## 2019-10-21 ENCOUNTER — Other Ambulatory Visit: Payer: Self-pay | Admitting: Gastroenterology

## 2019-11-01 ENCOUNTER — Ambulatory Visit: Payer: BC Managed Care – PPO | Admitting: Gastroenterology

## 2019-11-01 ENCOUNTER — Encounter: Payer: Self-pay | Admitting: Gastroenterology

## 2019-11-01 VITALS — BP 90/58 | HR 100 | Ht 64.0 in | Wt 139.8 lb

## 2019-11-01 DIAGNOSIS — M5440 Lumbago with sciatica, unspecified side: Secondary | ICD-10-CM | POA: Diagnosis not present

## 2019-11-01 DIAGNOSIS — G8929 Other chronic pain: Secondary | ICD-10-CM

## 2019-11-01 DIAGNOSIS — Z23 Encounter for immunization: Secondary | ICD-10-CM | POA: Diagnosis not present

## 2019-11-01 DIAGNOSIS — Z9225 Personal history of immunosupression therapy: Secondary | ICD-10-CM

## 2019-11-01 DIAGNOSIS — K5100 Ulcerative (chronic) pancolitis without complications: Secondary | ICD-10-CM | POA: Diagnosis not present

## 2019-11-01 MED ORDER — MESALAMINE 1.2 G PO TBEC: DELAYED_RELEASE_TABLET | ORAL | 3 refills | Status: DC

## 2019-11-01 NOTE — Patient Instructions (Signed)
You was given your FLU shot today  We will try to obtain your labs from Dr Dossie Der  We have refilled your Lialda for a 90 day and sent to your pharmacy  Follow up in 6 months  I appreciate the  opportunity to care for you  Thank You   Harl Bowie , MD

## 2019-11-01 NOTE — Progress Notes (Addendum)
Carly Cook    [id number]    1960/02/05  Primary Care Physician:Shaw, Gwyndolyn Saxon, MD  Referring Physician: Marton Redwood, MD [address] [place],  [place] [address]   Chief complaint:  Ulcerative colitis  HPI:  59 year old very pleasant female here for follow-up visit for ulcerative colitis  She is doing overall well from GI standpoint.  She is currently on Entyvio infusion maintenance dose every 8 weeks.  She is also taking Lialda 4.8 g daily  On average 2-3 soft bowel movements daily.  Denies any nausea, vomiting, abdominal pain, melena or bright red blood per rectum  Review of system positive for low back pain, has scoliosis and degenerative disc disease.  She is working with her PMD and also did physical therapy.  Wondering if she needs additional imaging and follow-up with a specialist, is planning to discuss it with her PMD.  Completed Covid vaccination series including booster  Relevant GI history:  Colonoscopy 03/08/2018: showed mild left-sided colitis, pancolonic diverticulosis and hemorrhoids.  She was previously followed by Dr Earle Gell  She was on Remicade previously, but discontinued due to possible systemic lupus like reaction/allergic reaction.She was treated with prednisone on and off for intermittent UC flares  Colonoscopy January 15, 2006 by Dr. Wynetta Emery showed normal colonic mucosa biopsies, biopsies were obtained.  Colonoscopy December 21, 2006 on pathology had activepancolitis with exudate consistent with chronic active inflammatory bowel diseaseand superimposed C. Difficile  Colonoscopy October 29, 2009 mild colitis in the distal sigmoid colon with random colon biopsies showed chronic active colitis with surface erosion in the rectosigmoid biopsies otherwise rest of the colon with no evidence of active inflammation  Abdominal ultrasound November 19, 2010 showed minimally dilated common duct and mild fatty  liver  Colonoscopy January 02, 2012 in [place] showed abnormal vascularity and erythema in the sigmoid and descending colon. A 4 mm hyperplastic polyp was removed from proximal rectum.  Colonoscopy December 29, 2014 in [place] showed erythema and granularity in the descending and sigmoid colon, recommended repeat colonoscopy in 3 years   Outpatient Encounter Medications as of 11/01/2019  Medication Sig   CALCIUM CITRATE PO Take 800 mg by mouth daily.   Cholecalciferol (VITAMIN D PO) Take 5,000 Units by mouth daily.    Cyanocobalamin (B-12 PO) Take by mouth.   MAGNESIUM BISGLYCINATE PO Take 400 mg by mouth daily.   mesalamine (LIALDA) 1.2 g EC tablet TAKE 4 TABLETS DAILY WITH BREAKFAST.   rosuvastatin (CRESTOR) 10 MG tablet    UNABLE TO FIND Take 200 mcg by mouth daily. Med Name: 1-K2 ( MK-7)   vedolizumab (ENTYVIO) 300 MG injection Infuse week 0, 2, 6 then every 8 weeks   VITAMIN K PO Take by mouth.   Zoledronic Acid (RECLAST IV) Inject into the vein.   [DISCONTINUED] Alpha-Lipoic Acid 600 MG CAPS Take by mouth.   [DISCONTINUED] Mesalamine-Cleanser (ROWASA) 4 g KIT Use every day at bedtime per rectum (Patient taking differently: Use every day at bedtime per rectum-alternate with canasa)   No facility-administered encounter medications on file as of 11/01/2019.    Allergies as of 11/01/2019 - Review Complete 11/01/2019  Allergen Reaction Noted   Norco [hydrocodone-acetaminophen] Other (See Comments) 03/16/2017   Remicade [infliximab]  08/31/2018   Erythromycin Rash 03/16/2017    Past Medical History:  Diagnosis Date   Breast cancer ([place]) 2015,2016   sx only   DJD (degenerative joint disease), lumbar  Ductal carcinoma in situ (DCIS) of right breast    Emphysema of lung ([place])    Per patient, she is early stage emphysema due to a 65 year smoking history   Hypercholesterolemia    Ulcerative colitis ([place])     Past  Surgical History:  Procedure Laterality Date   COLONOSCOPY  last 12/2014   FOOT FOREIGN BODY REMOVAL Right    FOOT FRACTURE SURGERY Left    SIMPLE MASTECTOMY Right    5 sentinel lymph nodes removed   SIMPLE MASTECTOMY Left    1 sentinel lymph node removed    Family History  Problem Relation Age of Onset   Diabetes Father    Osteoporosis Mother    High blood pressure Mother    High blood pressure Sister    Colon cancer Neg Hx    Colon polyps Neg Hx    Esophageal cancer Neg Hx    Prostate cancer Neg Hx    Rectal cancer Neg Hx    Pancreatic cancer Neg Hx     Social History   Socioeconomic History   Marital status: Single    Spouse name: Not on file   Number of children: Not on file   Years of education: Not on file   Highest education level: Not on file  Occupational History   Occupation: Stage manager  Tobacco Use   Smoking status: Former Smoker    Types: Cigarettes    Quit date: 03/02/2006    Years since quitting: 13.6   Smokeless tobacco: Never Used  Vaping Use   Vaping Use: Never used  Substance and Sexual Activity   Alcohol use: Yes    Comment: occ.   Drug use: Never   Sexual activity: Not on file  Other Topics Concern   Not on file  Social History Narrative   Not on file   Social Determinants of Health   Financial Resource Strain:    Difficulty of Paying Living Expenses: Not on file  Food Insecurity:    Worried About [place] in the Last Year: Not on file   Ran Out of Food in the Last Year: Not on file  Transportation Needs:    Lack of Transportation (Medical): Not on file   Lack of Transportation (Non-Medical): Not on file  Physical Activity:    Days of Exercise per Week: Not on file   Minutes of Exercise per Session: Not on file  Stress:    Feeling of Stress : Not on file  Social Connections:    Frequency of Communication with Friends and Family: Not on file   Frequency of Social Gatherings  with Friends and Family: Not on file   Attends Religious Services: Not on file   Active Member of Clubs or Organizations: Not on file   Attends Archivist Meetings: Not on file   Marital Status: Not on file  Intimate Partner Violence:    Fear of Current or Ex-Partner: Not on file   Emotionally Abused: Not on file   Physically Abused: Not on file   Sexually Abused: Not on file      Review of systems: All other review of systems negative except as mentioned in the HPI.   Physical Exam: Vitals:   11/01/19 0851  BP: (!) 90/58  Pulse: 100   Body mass index is 24 kg/m. Gen:      No acute distress HEENT:  sclera anicteric Abd:      soft, non-tender; no palpable masses,  no distension Ext:    No edema Neuro: alert and oriented x 3 Psych: normal mood and affect  Data Reviewed:  Reviewed labs, radiology imaging, old records and pertinent past GI work up   Assessment and Plan/Recommendations:  59 year old very pleasant female with history of breast cancer s/p bilateral mastectomy, ulcerative pancolitis initially diagnosed in 1992 in clinical remission on Entyvio and Lialda  Ulcerative colitis: In remission Continue Lialda 4.8 g daily and Entyvio maintenance dose every 8 weeks We will request labs from Dr. Audelia Hives office, she had blood work within the past 8 weeks, was unremarkable per patient  Due for surveillance colonoscopy Feb 2023  Health maintenance up-to-date, due for flu vaccine, will plan to give it to her during this visit  Chronic low back pain: Follow-up with PMD  Return in 6 months or sooner if needed   The patient was provided an opportunity to ask questions and all were answered. The patient agreed with the plan and demonstrated an understanding of the instructions.  Damaris Hippo , MD    CC: Marton Redwood, MD

## 2019-12-28 ENCOUNTER — Ambulatory Visit (HOSPITAL_COMMUNITY)
Admission: EM | Admit: 2019-12-28 | Discharge: 2019-12-28 | Disposition: A | Discharge status: Home / Self Care | Payer: BC Managed Care – PPO | Attending: Family Medicine | Admitting: Family Medicine

## 2019-12-28 ENCOUNTER — Ambulatory Visit (INDEPENDENT_AMBULATORY_CARE_PROVIDER_SITE_OTHER): Payer: BC Managed Care – PPO

## 2019-12-28 ENCOUNTER — Encounter (HOSPITAL_COMMUNITY): Payer: Self-pay | Admitting: Emergency Medicine

## 2019-12-28 DIAGNOSIS — R079 Chest pain, unspecified: Secondary | ICD-10-CM

## 2019-12-28 NOTE — ED Triage Notes (Signed)
Pt c/o chest pain onset this morning around 9 am. She states it feels like a tight sharpness on the left side. Pt states she has had a mastectomy.

## 2019-12-28 NOTE — Discharge Instructions (Signed)
You have been seen at the [facility] today for chest pain. Your evaluation today was not suggestive of any emergent condition requiring medical intervention at this time. Your ECG (heart tracing) did not show any worrisome changes. However, some medical problems make take more time to appear. Therefore, it's very important that you pay attention to any new symptoms or worsening of your current condition.  Please proceed directly to the Emergency Department immediately should you feel worse in any way or have any of the following symptoms: increasing or different chest pain, pain that spreads to your arm, neck, jaw, back or abdomen, shortness of breath, sweating episodes associated with chest pain, or nausea and vomiting.

## 2019-12-31 NOTE — ED Provider Notes (Signed)
[place]   [id number] 12/28/19 Arrival Time: 7169  ASSESSMENT & PLAN:  1. Chest pain, unspecified type     ECG: Performed today and interpreted by me: Normal sinus rhythm. Nonspecific ST abnormality. No STEMI.  I have personally viewed the imaging studies ordered this visit. No acute changes; no pneumothorax.  She is comfortable with home observation and PCP f/u.   Discharge Instructions     You have been seen at the [facility] today for chest pain. Your evaluation today was not suggestive of any emergent condition requiring medical intervention at this time. Your ECG (heart tracing) did not show any worrisome changes. However, some medical problems make take more time to appear. Therefore, it's very important that you pay attention to any new symptoms or worsening of your current condition.  Please proceed directly to the Emergency Department immediately should you feel worse in any way or have any of the following symptoms: increasing or different chest pain, pain that spreads to your arm, neck, jaw, back or abdomen, shortness of breath, sweating episodes associated with chest pain, or nausea and vomiting.      Chest pain precautions given. Reviewed expectations re: course of current medical issues. Questions answered. Outlined signs and symptoms indicating need for more acute intervention. Patient verbalized understanding. After Visit Summary given.   SUBJECTIVE:  History from: patient. Carly Cook is a 59 y.o. female who presents with complaint of non-radiating chest tightness/discomfort this morning approx 9 am. Sporadic. Describes as a tight sharpness; L-sided upper. No associated n/v/SOB/diaphoresis. Otherwise feeling well. H/O mastectomy. Pain does not limit her when present. Denies: exertional chest pressure/discomfort, fatigue, irregular heart beat, lower extremity edema, near-syncope, orthopnea, palpitations, paroxysmal nocturnal  dyspnea and syncope. Aggravating factors: have not been identified. Alleviating factors: have not been identified. Recent illnesses: none. Fever: absent. Ambulatory without assistance. Self/OTC treatment: none. History of similar: no. Illicit drug use: none.  Social History   Tobacco Use  Smoking Status Former Smoker  . Types: Cigarettes  . Quit date: 03/02/2006  . Years since quitting: 13.8  Smokeless Tobacco Never Used   Social History   Substance and Sexual Activity  Alcohol Use Yes   Comment: occ.     OBJECTIVE:  Vitals:   12/28/19 1450  BP: (!) 149/101  Pulse: 98  Resp: 18  Temp: 99.1 F (37.3 C)  TempSrc: Oral  SpO2: 98%    BP noted.  General appearance: alert, oriented, no acute distress Eyes: PERRLA; EOMI; conjunctivae normal HENT: normocephalic; atraumatic Neck: supple with FROM Lungs: without labored respirations; speaks full sentences without difficulty; CTAB Heart: regular rate and rhythm Chest Wall: without tenderness to palpation Abdomen: soft Extremities: without edema; without calf swelling or tenderness; symmetrical without gross deformities Skin: warm and dry; without rash or lesions Neuro: normal gait Psychological: alert and cooperative; normal mood and affect  Imaging: DG Chest 2 View  Result Date: 12/28/2019 CLINICAL DATA:  59 year old female with left-sided chest pain. EXAM: CHEST - 2 VIEW COMPARISON:  Chest CT dated 02/14/2019. FINDINGS: No focal consolidation, pleural effusion, pneumothorax. The cardiac silhouette is within limits. No acute osseous pathology. Bilateral axillary and chest wall surgical clips. IMPRESSION: No active cardiopulmonary disease. Electronically Signed   By: Anner Crete M.D.   On: 12/28/2019 15:23     Allergies  Allergen Reactions  . Norco [Hydrocodone-Acetaminophen] Other (See Comments)    Hallucinations, nausea  . Remicade [Infliximab]     MYALGIAS...   . Erythromycin Rash  Past Medical  History:  Diagnosis Date  . Breast cancer ([place]) [id number]   sx only  . DJD (degenerative joint disease), lumbar   . Ductal carcinoma in situ (DCIS) of right breast   . Emphysema of lung ([place])    Per patient, she is early stage emphysema due to a 33 year smoking history  . Hypercholesterolemia   . Ulcerative colitis ([place])    Social History   Socioeconomic History  . Marital status: Single    Spouse name: Not on file  . Number of children: Not on file  . Years of education: Not on file  . Highest education level: Not on file  Occupational History  . Occupation: Stage manager  Tobacco Use  . Smoking status: Former Smoker    Types: Cigarettes    Quit date: 03/02/2006    Years since quitting: 13.8  . Smokeless tobacco: Never Used  Vaping Use  . Vaping Use: Never used  Substance and Sexual Activity  . Alcohol use: Yes    Comment: occ.  . Drug use: Never  . Sexual activity: Not on file  Other Topics Concern  . Not on file  Social History Narrative  . Not on file   Social Determinants of Health   Financial Resource Strain: Not on file  Food Insecurity: Not on file  Transportation Needs: Not on file  Physical Activity: Not on file  Stress: Not on file  Social Connections: Not on file  Intimate Partner Violence: Not on file   Family History  Problem Relation Age of Onset  . Diabetes Father   . Osteoporosis Mother   . High blood pressure Mother   . High blood pressure Sister   . Colon cancer Neg Hx   . Colon polyps Neg Hx   . Esophageal cancer Neg Hx   . Prostate cancer Neg Hx   . Rectal cancer Neg Hx   . Pancreatic cancer Neg Hx    Past Surgical History:  Procedure Laterality Date  . COLONOSCOPY  last 12/2014  . FOOT FOREIGN BODY REMOVAL Right   . FOOT FRACTURE SURGERY Left   . SIMPLE MASTECTOMY Right    5 sentinel lymph nodes removed  . SIMPLE MASTECTOMY Left    1 sentinel lymph node removed     Vanessa Kick, MD 12/31/19 [phone]

## 2020-02-03 ENCOUNTER — Other Ambulatory Visit: Payer: Self-pay | Admitting: Internal Medicine

## 2020-02-03 ENCOUNTER — Other Ambulatory Visit: Payer: Self-pay | Admitting: Cardiovascular Disease

## 2020-02-03 DIAGNOSIS — F17201 Nicotine dependence, unspecified, in remission: Secondary | ICD-10-CM

## 2020-02-17 ENCOUNTER — Ambulatory Visit
Admission: RE | Admit: 2020-02-17 | Discharge: 2020-02-17 | Disposition: A | Discharge status: Home / Self Care | Payer: BC Managed Care – PPO | Source: Ambulatory Visit | Attending: Internal Medicine | Admitting: Internal Medicine

## 2020-02-17 DIAGNOSIS — F17201 Nicotine dependence, unspecified, in remission: Secondary | ICD-10-CM

## 2020-04-10 ENCOUNTER — Other Ambulatory Visit (INDEPENDENT_AMBULATORY_CARE_PROVIDER_SITE_OTHER): Payer: BC Managed Care – PPO

## 2020-04-10 ENCOUNTER — Other Ambulatory Visit: Payer: Self-pay

## 2020-04-10 DIAGNOSIS — K5100 Ulcerative (chronic) pancolitis without complications: Secondary | ICD-10-CM | POA: Diagnosis not present

## 2020-04-10 LAB — CBC WITH DIFFERENTIAL/PLATELET
Basophils Absolute: 0 10*3/uL (ref 0.0–0.1)
Basophils Relative: 0.4 % (ref 0.0–3.0)
Eosinophils Absolute: 0.1 10*3/uL (ref 0.0–0.7)
Eosinophils Relative: 1.3 % (ref 0.0–5.0)
HCT: 38.7 % (ref 36.0–46.0)
Hemoglobin: 12.9 g/dL (ref 12.0–15.0)
Lymphocytes Relative: 39.6 % (ref 12.0–46.0)
Lymphs Abs: 2.2 10*3/uL (ref 0.7–4.0)
MCHC: 33.3 g/dL (ref 30.0–36.0)
MCV: 83.7 fl (ref 78.0–100.0)
Monocytes Absolute: 0.5 10*3/uL (ref 0.1–1.0)
Monocytes Relative: 8.4 % (ref 3.0–12.0)
Neutro Abs: 2.8 10*3/uL (ref 1.4–7.7)
Neutrophils Relative %: 50.3 % (ref 43.0–77.0)
Platelets: 262 10*3/uL (ref 150.0–400.0)
RBC: 4.62 Mil/uL (ref 3.87–5.11)
RDW: 12.9 % (ref 11.5–15.5)
WBC: 5.6 10*3/uL (ref 4.0–10.5)

## 2020-04-10 LAB — COMPREHENSIVE METABOLIC PANEL
ALT: 23 U/L (ref 0–35)
AST: 17 U/L (ref 0–37)
Albumin: 4.5 g/dL (ref 3.5–5.2)
Alkaline Phosphatase: 59 U/L (ref 39–117)
BUN: 7 mg/dL (ref 6–23)
CO2: 23 mEq/L (ref 19–32)
Calcium: 9.5 mg/dL (ref 8.4–10.5)
Chloride: 105 mEq/L (ref 96–112)
Creatinine, Ser: 0.48 mg/dL (ref 0.40–1.20)
GFR: 101.76 mL/min (ref >60.00)
Glucose, Bld: 91 mg/dL (ref 70–99)
Potassium: 3.8 mEq/L (ref 3.5–5.1)
Sodium: 138 mEq/L (ref 135–145)
Total Bilirubin: 0.2 mg/dL (ref 0.2–1.2)
Total Protein: 7.3 g/dL (ref 6.0–8.3)

## 2020-04-10 LAB — HIGH SENSITIVITY CRP: CRP, High Sensitivity: <0.2 mg/L (ref 0.000–5.000)

## 2020-04-10 MED ORDER — BUDESONIDE 3 MG PO CPEP: 9.0000 mg | ORAL_CAPSULE | Freq: Every day | ORAL | 2 refills | Status: AC

## 2020-04-11 ENCOUNTER — Other Ambulatory Visit: Payer: BC Managed Care – PPO

## 2020-04-11 DIAGNOSIS — K5100 Ulcerative (chronic) pancolitis without complications: Secondary | ICD-10-CM

## 2020-04-15 LAB — GI PROFILE, STOOL, PCR

## 2020-05-14 ENCOUNTER — Ambulatory Visit: Payer: BC Managed Care – PPO | Admitting: Gastroenterology

## 2020-05-18 ENCOUNTER — Encounter: Payer: Self-pay | Admitting: Gastroenterology

## 2020-05-18 ENCOUNTER — Ambulatory Visit: Payer: BC Managed Care – PPO | Admitting: Gastroenterology

## 2020-05-18 VITALS — HR 78 | Ht 64.0 in | Wt 139.0 lb

## 2020-05-18 DIAGNOSIS — B3781 Candidal esophagitis: Secondary | ICD-10-CM

## 2020-05-18 DIAGNOSIS — K5180 Other ulcerative colitis without complications: Secondary | ICD-10-CM

## 2020-05-18 MED ORDER — FLUCONAZOLE 100 MG PO TABS: 100.0000 mg | ORAL_TABLET | Freq: Every day | ORAL | 0 refills | Status: DC

## 2020-05-18 NOTE — Patient Instructions (Signed)
We will be calling you to switch your infusions to [employer]  will need to come in for labs Entyvio drug trough before next dose   Take Budesonide 6 mg daily for 2 weeks then 3 mg daily till finished   We will send Diflucan to your pharmacy to take for 7 days  Due to recent changes in healthcare laws, you may see the results of your imaging and laboratory studies on MyChart before your provider has had a chance to review them.  We understand that in some cases there may be results that are confusing or concerning to you. Not all laboratory results come back in the same time frame and the provider may be waiting for multiple results in order to interpret others.  Please give [place] 48 hours in order for your provider to thoroughly review all the results before contacting the office for clarification of your results.   I appreciate the  opportunity to care for you  Thank You   Harl Bowie , MD

## 2020-05-18 NOTE — Progress Notes (Signed)
[name]    [id number]    12-26-1960  Primary Care Physician:Shaw, Emily Filbert., MD  Referring Physician: Ginger Organ., MD [address] [place],  [place] [address]   Chief complaint:  Ulcerative colitis  HPI:  61 year old very pleasant female here for follow-up visit for ulcerative colitis  In March 2022 she developed acute flare of symptoms with increased bowel frequency, abdominal cramping and diarrhea.  Her symptoms did not improve after Entyvio infusion, they had started about 2 weeks prior to the date she was due for her infusion but they continued until she started budesonide 9 mg daily and overall she is feeling better after most recent Entyvio infusion 2 weeks ago Denies any rectal bleeding or melena She is due for next Entyvio infusion on July 11, 2020 Bowel habits are back to baseline, on average 2-3 soft bowel movements daily.  She had sinus infection last month, was treated with course of antibiotics and now she is having oral thrush, has been taking lozenges but persistent symptoms.  She continues to have sinus infection. She had negative COVID testing  Denies any nausea, vomiting, abdominal pain, melena or bright red blood per rectum   Completed Covid vaccination series including booster  Relevant GIhistory:  Colonoscopy 03/08/2018:showed mild left-sided colitis, pancolonic diverticulosis and hemorrhoids.  She was previously followed by Dr Carly Cook  She was on Remicade previously, but discontinued due to possible systemic lupus like reaction/allergic reaction.She was treated with prednisone on and off for intermittent UC flares  Colonoscopy January 15, 2006 by Dr. Wynetta Cook showed normal colonic mucosa biopsies, biopsies were obtained.  Colonoscopy December 21, 2006 on pathology had activepancolitis with exudate consistent with chronic active inflammatory bowel diseaseand superimposed C. Difficile  Colonoscopy  October 29, 2009 mild colitis in the distal sigmoid colon with random colon biopsies showed chronic active colitis with surface erosion in the rectosigmoid biopsies otherwise rest of the colon with no evidence of active inflammation  Abdominal ultrasound November 19, 2010 showed minimally dilated common duct and mild fatty liver  Colonoscopy January 02, 2012 in [place] showed abnormal vascularity and erythema in the sigmoid and descending colon. A 4 mm hyperplastic polyp was removed from proximal rectum.  Colonoscopy December 29, 2014 in [place] showed erythema and granularity in the descending and sigmoid colon, recommended repeat colonoscopy in 3 years    Outpatient Encounter Medications as of 05/18/2020  Medication Sig  . budesonide (ENTOCORT EC) 3 MG 24 hr capsule Take 3 capsules (9 mg total) by mouth daily.  Marland Kitchen CALCIUM CITRATE PO Take 800 mg by mouth daily.  . Cholecalciferol (VITAMIN D PO) Take 5,000 Units by mouth daily.   . clotrimazole (MYCELEX) 10 MG troche Take by mouth.  . Cyanocobalamin (B-12 PO) Take by mouth.  . levothyroxine (SYNTHROID) 50 MCG tablet Take 50 mcg by mouth every morning.  Marland Kitchen MAGNESIUM BISGLYCINATE PO Take 400 mg by mouth daily.  . mesalamine (LIALDA) 1.2 g EC tablet TAKE 4 TABLETS DAILY WITH BREAKFAST.  . rosuvastatin (CRESTOR) 10 MG tablet   . UNABLE TO FIND Take 200 mcg by mouth daily. Med Name: 1-K2 ( MK-7)  . vedolizumab (ENTYVIO) 300 MG injection Infuse week 0, 2, 6 then every 8 weeks  . VITAMIN K PO Take by mouth.  . Zoledronic Acid (RECLAST IV) Inject into the vein.   No facility-administered encounter medications on file as of 05/18/2020.  Allergies as of 05/18/2020 - Review Complete 05/18/2020  Allergen Reaction Noted  . Norco [hydrocodone-acetaminophen] Other (See Comments) 03/16/2017  . Remicade [infliximab]  08/31/2018  . Erythromycin Rash 03/16/2017    Past Medical History:  Diagnosis Date  . Breast  cancer ([place]) [id number]   sx only  . DJD (degenerative joint disease), lumbar   . Ductal carcinoma in situ (DCIS) of right breast   . Emphysema of lung ([place])    Per patient, she is early stage emphysema due to a 33 year smoking history  . Hypercholesterolemia   . Ulcerative colitis [facility])     Past Surgical History:  Procedure Laterality Date  . COLONOSCOPY  last 12/2014  . FOOT FOREIGN BODY REMOVAL Right   . FOOT FRACTURE SURGERY Left   . SIMPLE MASTECTOMY Right    5 sentinel lymph nodes removed  . SIMPLE MASTECTOMY Left    1 sentinel lymph node removed    Family History  Problem Relation Age of Onset  . Diabetes Father   . Osteoporosis Mother   . High blood pressure Mother   . High blood pressure Sister   . Colon cancer Neg Hx   . Colon polyps Neg Hx   . Esophageal cancer Neg Hx   . Prostate cancer Neg Hx   . Rectal cancer Neg Hx   . Pancreatic cancer Neg Hx     Social History   Socioeconomic History  . Marital status: Single    Spouse name: Not on file  . Number of children: Not on file  . Years of education: Not on file  . Highest education level: Not on file  Occupational History  . Occupation: Stage manager  Tobacco Use  . Smoking status: Former Smoker    Types: Cigarettes    Quit date: 03/02/2006    Years since quitting: 14.2  . Smokeless tobacco: Never Used  Vaping Use  . Vaping Use: Never used  Substance and Sexual Activity  . Alcohol use: Yes    Comment: occ.  . Drug use: Never  . Sexual activity: Not on file  Other Topics Concern  . Not on file  Social History Narrative  . Not on file   Social Determinants of Health   Financial Resource Strain: Not on file  Food Insecurity: Not on file  Transportation Needs: Not on file  Physical Activity: Not on file  Stress: Not on file  Social Connections: Not on file  Intimate Partner Violence: Not on file      Review of systems: All other review of systems negative except as mentioned in the  HPI.   Physical Exam: Vitals:   05/18/20 1129  BP: 100/80  Pulse: 78  SpO2: 97%   Body mass index is 23.86 kg/m. Gen:      No acute distress HEENT:  sclera anicteric Abd:      soft, non-tender; no palpable masses, no distension Ext:    No edema Neuro: alert and oriented x 3 Psych: normal mood and affect  Data Reviewed:  Reviewed labs, radiology imaging, old records and pertinent past GI work up   Assessment and Plan/Recommendations:  60 year old very pleasant female with history of breast cancer s/p bilateral mastectomy, ulcerative pancolitis initially diagnosed in 1992 on Entyvio infusion every 8 weeks and Lialda  Recent flare of symptoms in March 2022.  GI pathogen panel negative and labs unremarkable Symptoms improved with budesonide 9 mg daily We will plan to taper to 6 mg daily for 2  weeks and 3 mg daily until she finishes the current prescription Continue Lialda 4.8 g daily and Entyvio maintenance dose every 8 weeks Check Entyvio drug trough and antibody level prior to next infusion on July 11, 2020  We will plan to switch her next infusion to [facility], will be more convenient for patient  Oropharyngeal candidiasis: Diflucan 100 mg daily for 7 days  Due for surveillance colonoscopy Feb 2023  Health maintenance up-to-date  Return in 3 months or sooner if needed  This visit required 40 minutes of patient care (this includes precharting, chart review, review of results, face-to-face time used for counseling as well as treatment plan and follow-up. The patient was provided an opportunity to ask questions and all were answered. The patient agreed with the plan and demonstrated an understanding of the instructions.  Carly Cook , MD    CC: Carly Organ., MD

## 2020-05-22 ENCOUNTER — Ambulatory Visit: Payer: BC Managed Care – PPO | Admitting: Gastroenterology

## 2020-06-14 ENCOUNTER — Telehealth: Payer: Self-pay | Admitting: Pharmacy Technician

## 2020-06-14 NOTE — Telephone Encounter (Signed)
Auth Submission: APPROVED Payer: [employer]BS Medication & CPT/J Code(s) submitted: Entyvio (Vedolizumab) [id number] Route of submission (phone, fax, portal): PHONE: [phone] FAX: [phone] Auth type: Buy/Bill Units/visits requested: 9 Reference number: [id number]

## 2020-06-21 ENCOUNTER — Other Ambulatory Visit: Payer: Self-pay | Admitting: Pharmacy Technician

## 2020-07-10 ENCOUNTER — Other Ambulatory Visit: Payer: BC Managed Care – PPO

## 2020-07-10 DIAGNOSIS — K5180 Other ulcerative colitis without complications: Secondary | ICD-10-CM

## 2020-07-11 ENCOUNTER — Ambulatory Visit (INDEPENDENT_AMBULATORY_CARE_PROVIDER_SITE_OTHER): Payer: BC Managed Care – PPO | Admitting: *Deleted

## 2020-07-11 ENCOUNTER — Other Ambulatory Visit: Payer: Self-pay

## 2020-07-11 VITALS — BP 114/74 | HR 66 | Temp 98.2°F | Resp 20

## 2020-07-11 DIAGNOSIS — K5100 Ulcerative (chronic) pancolitis without complications: Secondary | ICD-10-CM | POA: Diagnosis not present

## 2020-07-11 MED ORDER — FAMOTIDINE IN NACL 20-0.9 MG/50ML-% IV SOLN: 20.0000 mg | Freq: Once | INTRAVENOUS | Status: DC | PRN

## 2020-07-11 MED ORDER — SODIUM CHLORIDE 0.9% FLUSH: 3.0000 mL | Freq: Once | INTRAVENOUS | Status: DC | PRN

## 2020-07-11 MED ORDER — ALTEPLASE 2 MG IJ SOLR: 2.0000 mg | Freq: Once | INTRAMUSCULAR | Status: DC | PRN

## 2020-07-11 MED ORDER — METHYLPREDNISOLONE SODIUM SUCC 125 MG IJ SOLR: 125.0000 mg | Freq: Once | INTRAMUSCULAR | Status: DC | PRN

## 2020-07-11 MED ORDER — SODIUM CHLORIDE 0.9% FLUSH: 10.0000 mL | Freq: Once | INTRAVENOUS | Status: DC | PRN

## 2020-07-11 MED ORDER — DIPHENHYDRAMINE HCL 50 MG/ML IJ SOLN
50.0000 mg | Freq: Once | INTRAMUSCULAR | Status: DC | PRN
Start: 2020-07-11 — End: 2020-07-11

## 2020-07-11 MED ORDER — HEPARIN SOD (PORK) LOCK FLUSH 100 UNIT/ML IV SOLN: 500.0000 [IU] | Freq: Once | INTRAVENOUS | Status: DC | PRN

## 2020-07-11 MED ORDER — ANTICOAGULANT SODIUM CITRATE 4% (200MG/5ML) IV SOLN: 5.0000 mL | Freq: Once | Status: DC | PRN

## 2020-07-11 MED ORDER — SODIUM CHLORIDE 0.9 % IV SOLN: Freq: Once | INTRAVENOUS | Status: DC | PRN

## 2020-07-11 MED ORDER — VEDOLIZUMAB 300 MG IV SOLR
300.0000 mg | Freq: Once | INTRAVENOUS | Status: AC
  Administered 2020-07-11: 300 mg via INTRAVENOUS
  Filled 2020-07-11: qty 5

## 2020-07-11 MED ORDER — ALBUTEROL SULFATE HFA 108 (90 BASE) MCG/ACT IN AERS: 2.0000 | INHALATION_SPRAY | Freq: Once | RESPIRATORY_TRACT | Status: DC | PRN

## 2020-07-11 MED ORDER — EPINEPHRINE 0.3 MG/0.3ML IJ SOAJ: 0.3000 mg | Freq: Once | INTRAMUSCULAR | Status: DC | PRN

## 2020-07-11 MED ORDER — HEPARIN SOD (PORK) LOCK FLUSH 100 UNIT/ML IV SOLN: 250.0000 [IU] | Freq: Once | INTRAVENOUS | Status: DC | PRN

## 2020-07-11 NOTE — Progress Notes (Signed)
[id number]

## 2020-07-11 NOTE — Progress Notes (Signed)
Diagnosis: Crohn's Disease  Provider:  Marshell Garfinkel, MD  Procedure: Infusion  IV Type: Peripheral, IV Location: L Antecubital  Entyvio (Vedolizumab), Dose: 300 mg  Infusion Start Time: 0567  Infusion Stop Time: 8893  Post Infusion IV Care: Observation period completed  Discharge: Condition: Good, Destination: Home . AVS provided to patient.   Performed by:  Oren Beckmann, RN

## 2020-07-18 LAB — SERIAL MONITORING

## 2020-07-19 LAB — VEDOLIZUMAB AND ANTI-VEDO AB
Anti-Vedolizumab Antibody: <25 ng/mL
Vedolizumab: 15 ug/mL

## 2020-08-19 NOTE — Progress Notes (Signed)
08/19/2020 Carly Cook [id number] 15-Jul-1960   Chief Complaint: Ulcerative colitis follow up   History of Present Illness: Carly Cook is a 60 year old female with a past medical history of emphysema, breast cancer s/p bilateral mastectomy, hyperlipidemia and ulcerative pancolitis initially diangosed in 1992 previously on Remicade which was discontinued due to lupus like reaction treated on and off with steroids then started on Entvio and Lialda. She was last seen in office by Dr. Silverio Decamp on 05/18/2020. At that time, she reported having a sinus infection 04/2020 treated with antibiotics then developed a sore throat diagnosed with thrush. She was prescribed Diflucan 144m po QD x 7 days.  She stated her sore throat symptoms abated when she was off of budesonide.  Vedolizumab drug level was 15 and Vedolizumab antibody level was < 25 ng/ml. Her last Entyvio infusion was on 07/11/2020.  She remains on Lialda 4.8 gm p.o. daily. Currently, she feels well.  She denies having any abdominal pain.  She is passing a normal formed brown bowel movement daily.  No rectal bleeding, melena or mucus per the rectum.  Her appetite is good.  Her weight is stable.  No further infections since she was treated for the sinus and thrush infection as noted above.  No NSAID use. Her most recent colonoscopy was 03/08/2018 which showed mild left-sided colitis without dysplasia, pancolonic diverticulosis and hemorrhoids. She was advised by Dr. NSilverio Decampto repeat a colonoscopy in 3 years.    CBC Latest Ref Rng & Units 04/10/2020 12/20/2018 06/14/2018  WBC 4.0 - 10.5 K/uL 5.6 5.6 4.9  Hemoglobin 12.0 - 15.0 g/dL 12.9 12.7 12.5  Hematocrit 36.0 - 46.0 % 38.7 39.1 36.7  Platelets 150.0 - 400.0 K/uL 262.0 275.0 244.0    CMP Latest Ref Rng & Units 04/10/2020 12/20/2018 06/14/2018  Glucose 70 - 99 mg/dL 91 109(H) 99  BUN 6 - 23 mg/dL 7 8 13   Creatinine 0.40 - 1.20 mg/dL 0.48 0.54 0.54  Sodium 135 - 145 mEq/L 138 137 140   Potassium 3.5 - 5.1 mEq/L 3.8 3.9 4.1  Chloride 96 - 112 mEq/L 105 102 106  CO2 19 - 32 mEq/L 23 28 22   Calcium 8.4 - 10.5 mg/dL 9.5 9.6 9.2  Total Protein 6.0 - 8.3 g/dL 7.3 7.2 6.9  Total Bilirubin 0.2 - 1.2 mg/dL 0.2 0.3 0.2  Alkaline Phos 39 - 117 U/L 59 72 69  AST 0 - 37 U/L 17 20 20   ALT 0 - 35 U/L 23 27 26      Past Medical History:  Diagnosis Date   Breast cancer ([place] 2015,[place]   sx only   DJD (degenerative joint disease), lumbar    Ductal carcinoma in situ (DCIS) of right breast    Emphysema of lung ([place]    Per patient, she is early stage emphysema due to a 33 year smoking history   Hypercholesterolemia    Ulcerative colitis ([place]    Current Outpatient Medications on File Prior to Visit  Medication Sig Dispense Refill   CALCIUM CITRATE PO Take 800 mg by mouth daily.     Cholecalciferol (VITAMIN D PO) Take 5,000 Units by mouth daily.      Cyanocobalamin (B-12 PO) Take by mouth.     levothyroxine (SYNTHROID) 50 MCG tablet Take 50 mcg by mouth every morning.     MAGNESIUM BISGLYCINATE PO Take 400 mg by mouth daily.     mesalamine (LIALDA) 1.2 g EC tablet TAKE 4 TABLETS  DAILY WITH BREAKFAST. 360 tablet 3   rosuvastatin (CRESTOR) 10 MG tablet      UNABLE TO FIND Take 200 mcg by mouth daily. Med Name: 1-K2 ( MK-7)     vedolizumab (ENTYVIO) 300 MG injection Infuse week 0, 2, 6 then every 8 weeks 1 each 11   VITAMIN K PO Take by mouth.     No current facility-administered medications on file prior to visit.   Allergies  Allergen Reactions   Norco [Hydrocodone-Acetaminophen] Other (See Comments)    Hallucinations, nausea   Remicade [Infliximab]     MYALGIAS...    Erythromycin Rash   Current Medications, Allergies, Past Medical History, Past Surgical History, Family History and Social History were reviewed in [employer] record.  Review of Systems:   Constitutional: Negative for fever, sweats, chills or weight loss.  Respiratory: Negative for  shortness of breath.   Cardiovascular: Negative for chest pain, palpitations and leg swelling.  Gastrointestinal: See HPI.  Musculoskeletal: Negative for back pain or muscle aches.  Neurological: Negative for dizziness, headaches or paresthesias.    Physical Exam: BP 108/68   Pulse 76   Ht 5' 4"  (1.626 m)   Wt 142 lb (64.4 kg)   BMI 24.37 kg/m   General: 60 year old female in no acute distress. Head: Normocephalic and atraumatic. Eyes: No scleral icterus. Conjunctiva pink . Ears: Normal auditory acuity. Mouth: Dentition intact. No ulcers or lesions.  Questionable scant amount of white matter to the back of her tongue. Lungs: Clear throughout to auscultation. Heart: Regular rate and rhythm, no murmur. Abdomen: Soft, nontender and nondistended. No masses or hepatomegaly. Normal bowel sounds x 4 quadrants.  Rectal: Deferred.  Musculoskeletal: Symmetrical with no gross deformities. Extremities: No edema. Neurological: Alert oriented x 4. No focal deficits.  Psychological: Alert and cooperative. Normal mood and affect  Assessment and Recommendations:  60 year old female with a history of pan ulcerative colitis, stable on Lialda 4.8gm po QD and Entyvio infusion Q 8 weeks. No abdominal pain, normal bowel movements. -Continue Entyvio infusions Q 8 weeks and Lialda 4.8 gm po QD as previously ordered -Follow-up in office in 4 months and as needed -Next surveillance colonoscopy due 02/2021 -Advised flu vaccination early fall 2022

## 2020-08-20 ENCOUNTER — Ambulatory Visit: Payer: BC Managed Care – PPO | Admitting: Nurse Practitioner

## 2020-08-20 ENCOUNTER — Encounter: Payer: Self-pay | Admitting: Nurse Practitioner

## 2020-08-20 VITALS — BP 108/68 | HR 76 | Ht 64.0 in | Wt 142.0 lb

## 2020-08-20 DIAGNOSIS — K5180 Other ulcerative colitis without complications: Secondary | ICD-10-CM | POA: Diagnosis not present

## 2020-08-20 NOTE — Patient Instructions (Addendum)
If you are age 62 or younger, your body mass index should be between 19-25. Your Body mass index is 24.37 kg/m. If this is out of the aformentioned range listed, please consider follow up with your Primary Care Provider.   The [facility] providers would like to encourage you to use [facility] to communicate with providers for non-urgent requests or questions.  Due to long hold times on the telephone, sending your provider a message by [facility] may be faster and more efficient way to get a response. Please allow 48 business hours for a response.  Please remember that this is for non-urgent requests/questions.  Please follow up with Dr. Silverio Decamp in 4 months. Next colonoscopy is due Feb 2023. Continue LIALDA and ENTYVIO as prescribed Recommend a flu shot this Fall.  It was great seeing you today! Thank you for entrusting me with your care and choosing [facility].  Noralyn Pick, CRNP

## 2020-08-23 IMAGING — CT CT HEART SCORING
3 series · 14 of 20 positions shown, 16 images · non-contrast
Comparison: None.

CLINICAL DATA: Elevated cholesterol. Family history of heart
disease. Breast cancer with bilateral mastectomy.

EXAM:
CT HEART FOR CALCIUM SCORING
TECHNIQUE: CT heart was performed using prospective ECG gating.
A non-contrast exam for calcium scoring was performed.
Note that this exam targets the heart and the chest was not imaged
in its entirety.

[Series 2: calcium scoring 2.00 qr36 bestdiast 60% · axial · 0.29mm/px · z∈[+1601,+1685]mm · 4 of 70 slices shown]
[im 14/70  vessel]
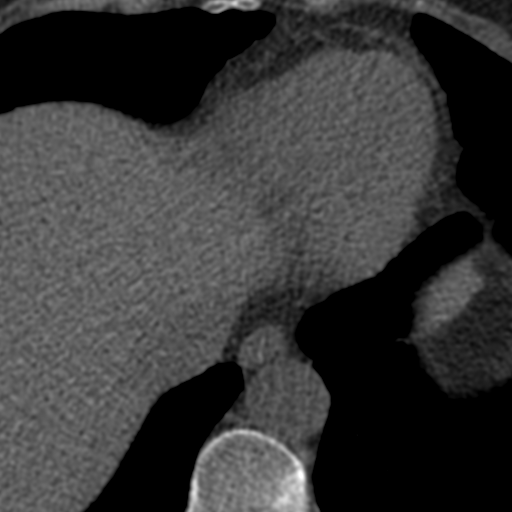
[im 28/70  vessel]
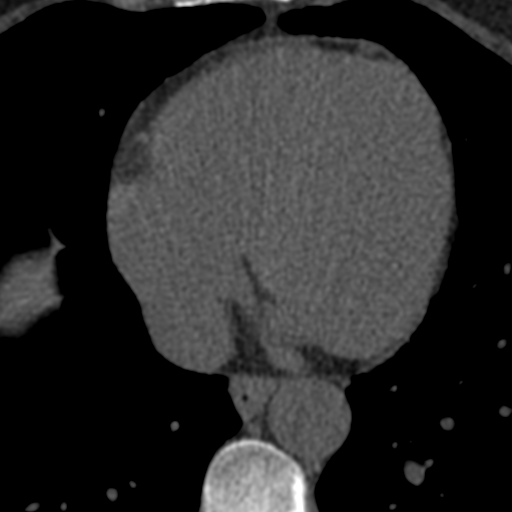
[im 42/70  vessel]
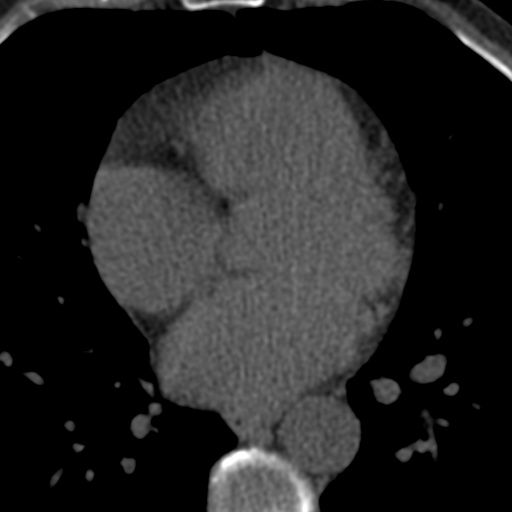
[im 56/70  vessel]
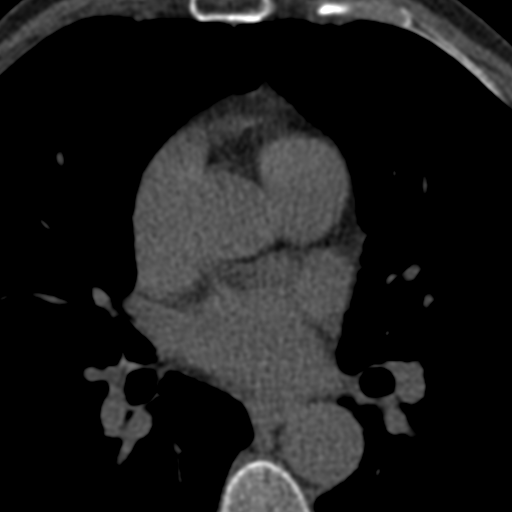

[Series 3: calcium scoring 2.00 br40 bestdiast 60% ax fov · axial · 0.45mm/px · z∈[+1597,+1689]mm · 5 of 70 slices shown, 7 images]
[im 12/70  vessel]
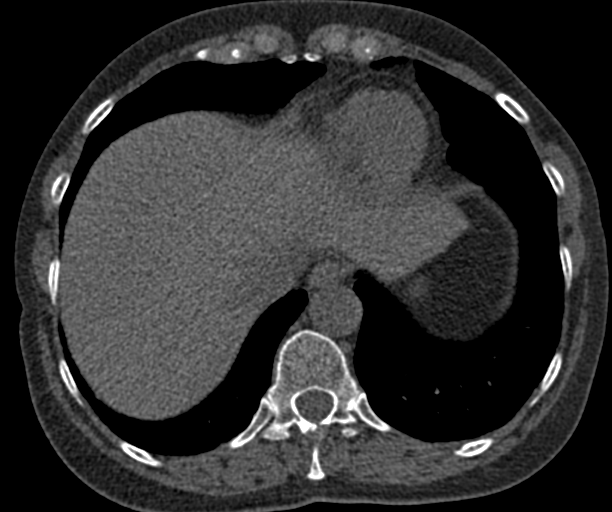
[im 12/70  lung]
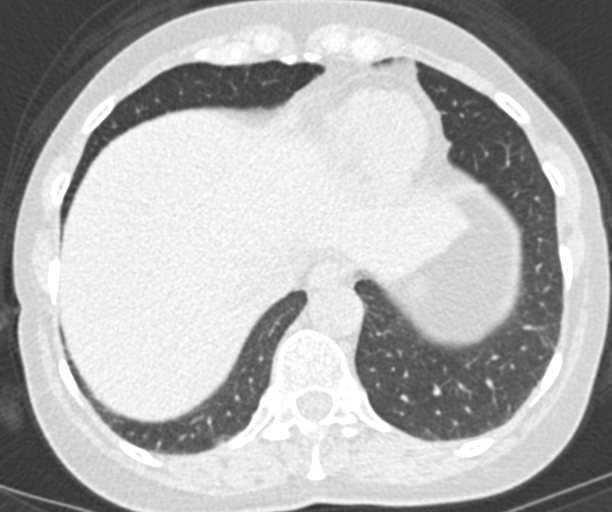
[im 24/70  vessel]
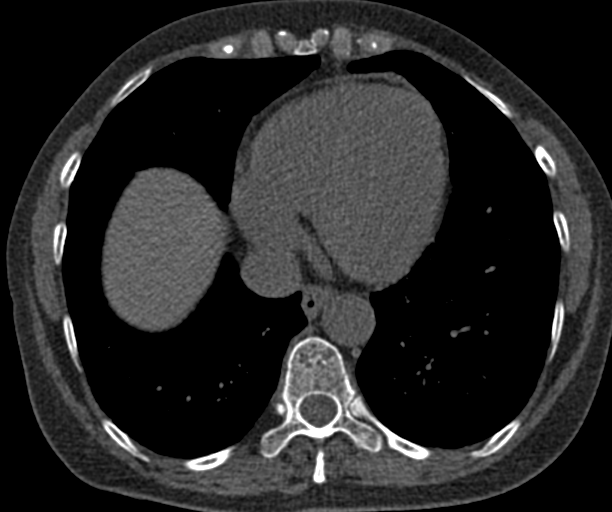
[im 35/70  vessel]
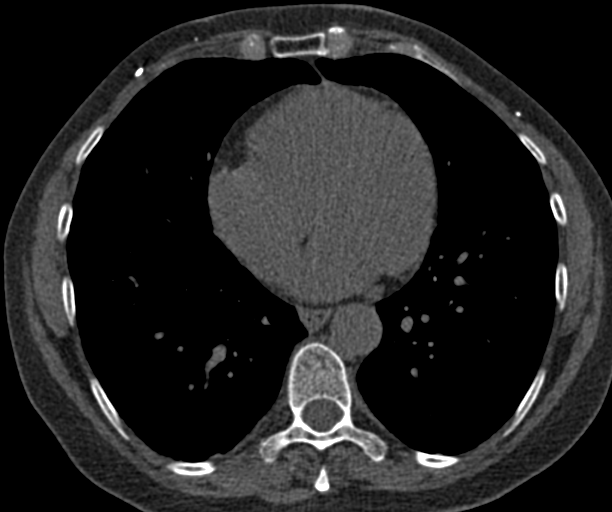
[im 47/70  vessel]
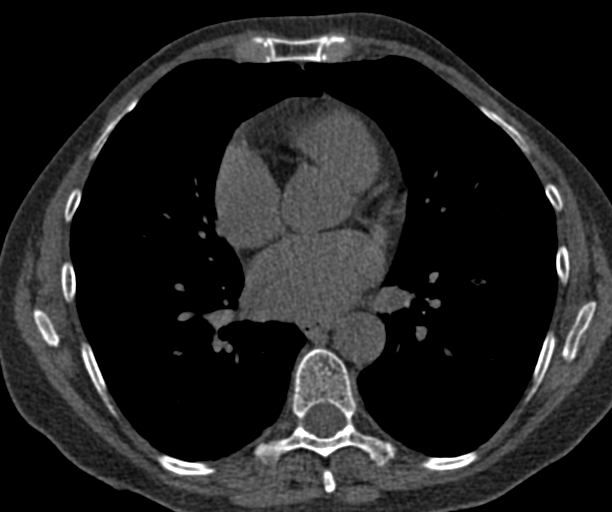
[im 58/70  vessel]
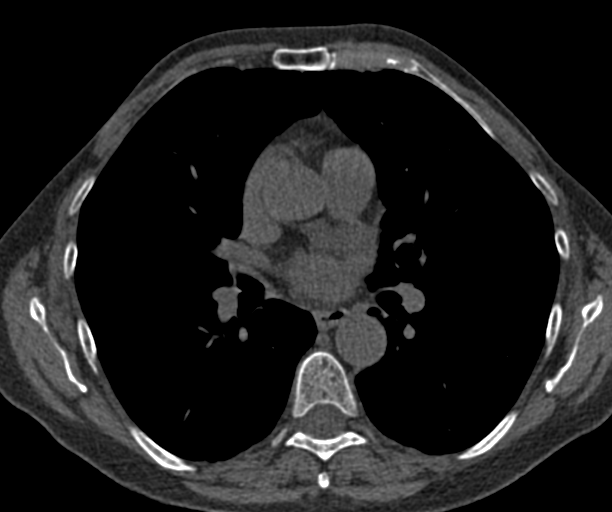
[im 58/70  lung]
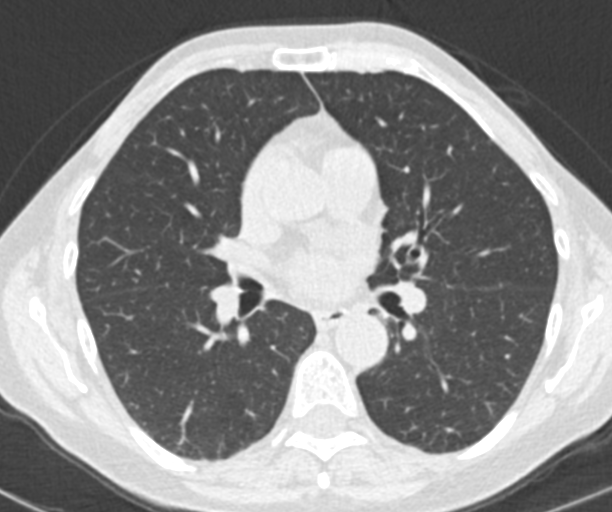

[Series 9: calcium scoring 2.00 br60 bestdiast 60% ax fov · axial · 0.45mm/px · z∈[+1597,+1689]mm · 5 of 70 slices shown]
[im 12/70  vessel]
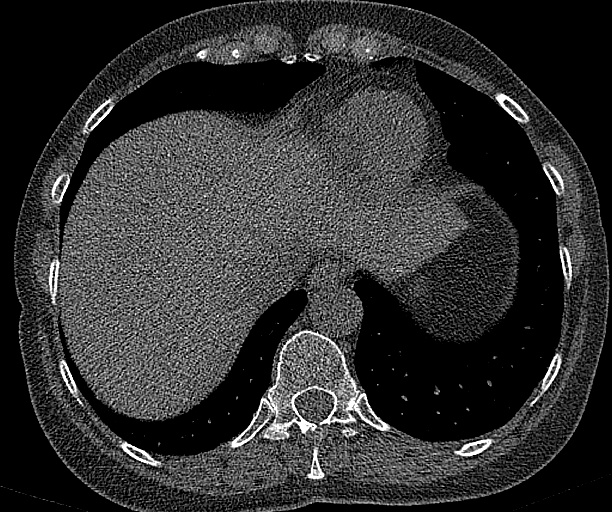
[im 24/70  vessel]
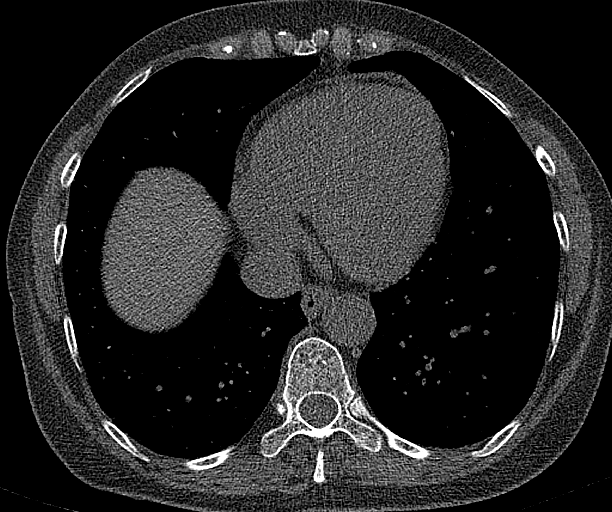
[im 35/70  vessel]
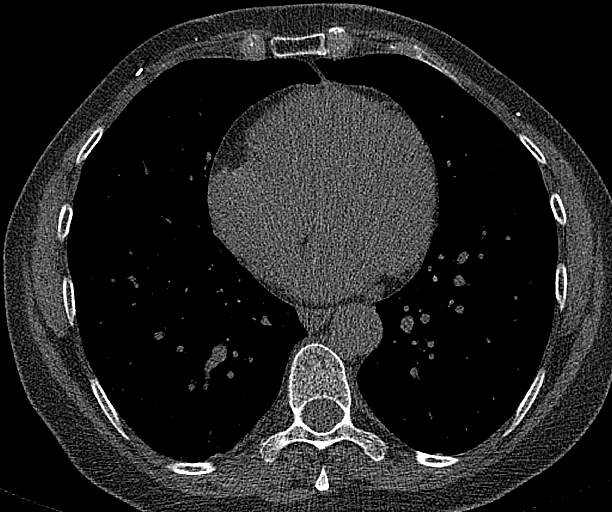
[im 47/70  vessel]
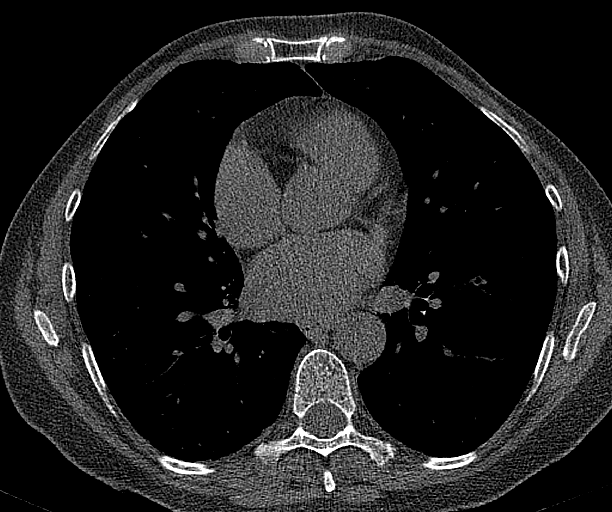
[im 58/70  vessel]
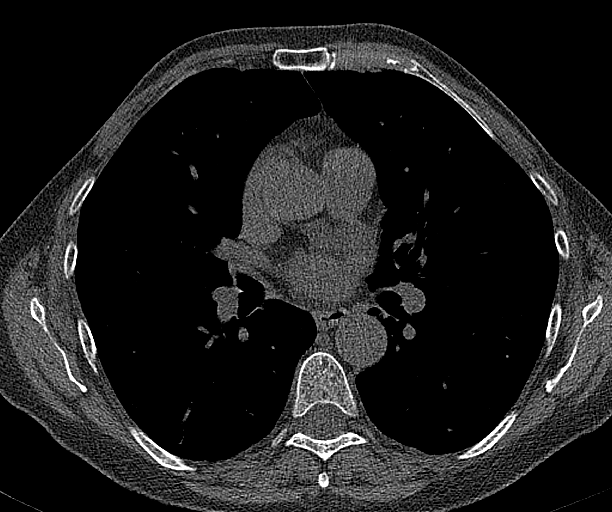

[14 of 20 positions shown; findings below may reference images not displayed]

FINDINGS: Technical quality: Good.

CORONARY CALCIUM

Total Agatston Score: 91.0-coronary calcium is identified within the
left main, proximal left circumflex, and proximal LAD coronary
arteries, including on image [DATE].

[HOSPITAL] percentile:  90th

OTHER FINDINGS:

Cardiovascular: Normal aortic caliber. Normal heart size, without
pericardial effusion.

Mediastinum/Nodes: No imaged thoracic adenopathy.

Lungs/Pleura: No imaged pleural fluid. Clear imaged lungs.

Upper Abdomen: Normal imaged portions of the liver, spleen, stomach.

Musculoskeletal: Bilateral mastectomy. No acute osseous abnormality.
IMPRESSION: 1. Agatston score of 91.0, corresponding to 90th percentile for age,
sex, and race based cohort. Extensive calcification identified
within the left main coronary artery.
2. No acute process in the imaged chest.

## 2020-09-06 ENCOUNTER — Other Ambulatory Visit: Payer: Self-pay

## 2020-09-06 ENCOUNTER — Ambulatory Visit (INDEPENDENT_AMBULATORY_CARE_PROVIDER_SITE_OTHER): Payer: BC Managed Care – PPO

## 2020-09-06 VITALS — BP 124/76 | HR 75 | Temp 98.6°F | Resp 16

## 2020-09-06 DIAGNOSIS — K5100 Ulcerative (chronic) pancolitis without complications: Secondary | ICD-10-CM | POA: Diagnosis not present

## 2020-09-06 MED ORDER — VEDOLIZUMAB 300 MG IV SOLR
300.0000 mg | Freq: Once | INTRAVENOUS | Status: AC
  Administered 2020-09-06: 300 mg via INTRAVENOUS
  Filled 2020-09-06: qty 5

## 2020-09-06 NOTE — Progress Notes (Signed)
Diagnosis: Ulcerative Colitis  Provider:  Marshell Garfinkel, MD  Procedure: Infusion  IV Type: Peripheral, IV Location: L Antecubital  Entyvio (Vedolizumab), Dose: 300 mg  Infusion Start Time: 7121  Infusion Stop Time: 9758  Post Infusion IV Care: Observation period completed  Discharge: Condition: Good, Destination: Home . AVS provided to patient.   Performed by:  Paul Dykes, RN

## 2020-09-10 NOTE — Progress Notes (Signed)
Reviewed and agree with documentation and assessment and plan. K. Veena Neeko Pharo , MD   

## 2020-11-01 ENCOUNTER — Ambulatory Visit: Payer: BC Managed Care – PPO

## 2020-11-09 ENCOUNTER — Ambulatory Visit (INDEPENDENT_AMBULATORY_CARE_PROVIDER_SITE_OTHER): Payer: BC Managed Care – PPO

## 2020-11-09 ENCOUNTER — Other Ambulatory Visit: Payer: Self-pay

## 2020-11-09 VITALS — BP 127/68 | HR 63 | Temp 98.5°F | Resp 16 | Ht 64.0 in | Wt 139.2 lb

## 2020-11-09 DIAGNOSIS — K5100 Ulcerative (chronic) pancolitis without complications: Secondary | ICD-10-CM | POA: Diagnosis not present

## 2020-11-09 MED ORDER — SODIUM CHLORIDE 0.9 % IV SOLN: Freq: Once | INTRAVENOUS | Status: DC | PRN

## 2020-11-09 MED ORDER — ALBUTEROL SULFATE HFA 108 (90 BASE) MCG/ACT IN AERS: 2.0000 | INHALATION_SPRAY | Freq: Once | RESPIRATORY_TRACT | Status: DC | PRN

## 2020-11-09 MED ORDER — VEDOLIZUMAB 300 MG IV SOLR
300.0000 mg | Freq: Once | INTRAVENOUS | Status: AC
  Administered 2020-11-09: 300 mg via INTRAVENOUS
  Filled 2020-11-09: qty 5

## 2020-11-09 MED ORDER — EPINEPHRINE 0.3 MG/0.3ML IJ SOAJ: 0.3000 mg | Freq: Once | INTRAMUSCULAR | Status: DC | PRN

## 2020-11-09 MED ORDER — DIPHENHYDRAMINE HCL 50 MG/ML IJ SOLN: 50.0000 mg | Freq: Once | INTRAMUSCULAR | Status: DC | PRN

## 2020-11-09 MED ORDER — METHYLPREDNISOLONE SODIUM SUCC 125 MG IJ SOLR: 125.0000 mg | Freq: Once | INTRAMUSCULAR | Status: DC | PRN

## 2020-11-09 MED ORDER — FAMOTIDINE IN NACL 20-0.9 MG/50ML-% IV SOLN: 20.0000 mg | Freq: Once | INTRAVENOUS | Status: DC | PRN

## 2020-11-09 NOTE — Progress Notes (Signed)
Diagnosis: Ulcerative pancolitis  Provider:  Marshell Garfinkel, MD  Procedure: Infusion  IV Type: Peripheral, IV Location: L Antecubital  Entyvio (Vedolizumab), Dose: 300 mg  Infusion Start Time: 15.24 11/09/2020  Infusion Stop Time: 2751 11/09/2020  Post Infusion IV Care: Peripheral IV Discontinued  Discharge: Condition: Good, Destination: Home . AVS provided to patient.   Performed by:  Arnoldo Morale, RN

## 2020-11-13 ENCOUNTER — Other Ambulatory Visit: Payer: Self-pay

## 2020-11-13 ENCOUNTER — Other Ambulatory Visit: Payer: Self-pay | Admitting: Pharmacy Technician

## 2020-11-13 MED ORDER — ENTYVIO 300 MG IV SOLR: INTRAVENOUS | 11 refills | Status: AC

## 2020-11-18 ENCOUNTER — Other Ambulatory Visit: Payer: Self-pay | Admitting: Gastroenterology

## 2020-12-20 ENCOUNTER — Other Ambulatory Visit: Payer: Self-pay | Admitting: Gastroenterology

## 2020-12-27 ENCOUNTER — Ambulatory Visit: Payer: BC Managed Care – PPO

## 2021-01-08 ENCOUNTER — Ambulatory Visit: Payer: BC Managed Care – PPO

## 2021-02-19 ENCOUNTER — Other Ambulatory Visit: Payer: Self-pay | Admitting: Gastroenterology

## 2021-02-21 ENCOUNTER — Ambulatory Visit: Payer: BC Managed Care – PPO

## 2021-03-05 ENCOUNTER — Other Ambulatory Visit: Payer: Self-pay | Admitting: Internal Medicine

## 2021-03-05 DIAGNOSIS — F17201 Nicotine dependence, unspecified, in remission: Secondary | ICD-10-CM

## 2021-03-14 ENCOUNTER — Ambulatory Visit: Payer: BC Managed Care – PPO | Admitting: Gastroenterology

## 2021-03-14 ENCOUNTER — Encounter: Payer: Self-pay | Admitting: Gastroenterology

## 2021-03-14 VITALS — BP 100/58 | HR 65 | Ht 64.75 in | Wt 134.0 lb

## 2021-03-14 DIAGNOSIS — K5180 Other ulcerative colitis without complications: Secondary | ICD-10-CM

## 2021-03-14 MED ORDER — ONDANSETRON HCL 4 MG PO TABS: ORAL_TABLET | ORAL | 0 refills | Status: DC

## 2021-03-14 MED ORDER — MESALAMINE 1.2 G PO TBEC: DELAYED_RELEASE_TABLET | ORAL | 11 refills | Status: DC

## 2021-03-14 NOTE — Progress Notes (Signed)
? ?       ? ?Carly Cook    [id number]    [birth year] ? ?Primary Care Physician:Shaw, Emily Filbert., MD ? ?Referring Physician: Ginger Organ., MD ?[address] ?[place],  [place] [address] ? ? ?Chief complaint:  Ulcerative Colitis ? ?HPI: ? ?61 year old very pleasant female here for follow-up visit for ulcerative colitis ? ?Overall doing well on Entyvio and mesalamine ?She has occasional left lower quadrant abdominal discomfort, felt that this morning.  It usually self resolves. ?Denies any rectal bleeding, change in bowel habits, nausea or vomiting. ?  ? ?Relevant GI history: ?  ?Colonoscopy 03/08/2018: showed mild left-sided colitis, pancolonic diverticulosis and hemorrhoids. ?  ?She was previously followed by Dr Earle Gell ?  ?She was on Remicade previously, but discontinued due to possible systemic lupus like reaction/allergic reaction.  She was treated with prednisone on and off for intermittent UC flares ?  ?Colonoscopy January 15, 2006 by Dr. Wynetta Emery showed normal colonic mucosa biopsies, biopsies were obtained.   ?  ?Colonoscopy December 21, 2006 on pathology had active pan colitis with exudate consistent with chronic active inflammatory bowel disease and superimposed C. Difficile ?  ?Colonoscopy October 29, 2009 mild colitis in the distal sigmoid colon with random colon biopsies showed chronic active colitis with surface erosion in the rectosigmoid biopsies otherwise rest of the colon with no evidence of active inflammation ?  ?Abdominal ultrasound November 19, 2010 showed minimally dilated common duct and mild fatty liver ?  ?Colonoscopy January 02, 2012 in [place] showed abnormal vascularity and erythema in the sigmoid and descending colon.  A 4 mm hyperplastic polyp was removed from proximal rectum. ?  ?Colonoscopy December 29, 2014 in [place] showed erythema and granularity in the descending and sigmoid colon, recommended repeat colonoscopy in 3 years ?   ? ? ?Outpatient Encounter Medications as of 03/14/2021  ?Medication Sig  ? CALCIUM CITRATE PO Take 800 mg by mouth daily.  ? Cholecalciferol (VITAMIN D PO) Take 5,000 Units by mouth daily.   ? Cyanocobalamin (B-12 PO) Take by mouth.  ? levothyroxine (SYNTHROID) 50 MCG tablet Take 50 mcg by mouth every morning.  ? MAGNESIUM BISGLYCINATE PO Take 400 mg by mouth daily.  ? mesalamine (LIALDA) 1.2 g EC tablet TAKE 4 TABLETS DAILY WITH BREAKFAST. MUST HAVE OFFICE VISIT FOR FURTHER REFILLS  ? rosuvastatin (CRESTOR) 10 MG tablet   ? UNABLE TO FIND Take 200 mcg by mouth daily. Med Name: 1-K2 ( MK-7)  ? vedolizumab (ENTYVIO) 300 MG injection Infuse every 8 weeks per protocol  ? VITAMIN K PO Take by mouth.  ? ?No facility-administered encounter medications on file as of 03/14/2021.  ? ? ?Allergies as of 03/14/2021 - Review Complete 08/20/2020  ?Allergen Reaction Noted  ? Norco [hydrocodone-acetaminophen] Other (See Comments) 03/16/2017  ? Remicade [infliximab]  08/31/2018  ? Erythromycin Rash 03/16/2017  ? ? ?Past Medical History:  ?Diagnosis Date  ? Breast cancer ([place]) 3646,[id number]  ? sx only  ? DJD (degenerative joint disease), lumbar   ? Ductal carcinoma in situ (DCIS) of right breast   ? Emphysema of lung ([place])   ? Per patient, she is early stage emphysema due to a 36 year smoking history  ? Hypercholesterolemia   ? Ulcerative colitis ([place])   ? ? ?Past Surgical History:  ?Procedure Laterality Date  ? COLONOSCOPY  last 12/2014  ? FOOT FOREIGN BODY REMOVAL Right   ? FOOT FRACTURE SURGERY Left   ?  SIMPLE MASTECTOMY Right   ? 5 sentinel lymph nodes removed  ? SIMPLE MASTECTOMY Left   ? 1 sentinel lymph node removed  ? ? ?Family History  ?Problem Relation Age of Onset  ? Diabetes Father   ? Osteoporosis Mother   ? High blood pressure Mother   ? High blood pressure Sister   ? Colon cancer Neg Hx   ? Colon polyps Neg Hx   ? Esophageal cancer Neg Hx   ? Prostate cancer Neg Hx   ? Rectal cancer Neg Hx   ? Pancreatic cancer Neg Hx    ? ? ?Social History  ? ?Socioeconomic History  ? Marital status: Single  ?  Spouse name: Not on file  ? Number of children: Not on file  ? Years of education: Not on file  ? Highest education level: Not on file  ?Occupational History  ? Occupation: Stage manager  ?Tobacco Use  ? Smoking status: Former  ?  Types: Cigarettes  ?  Quit date: 03/02/2006  ?  Years since quitting: 15.0  ? Smokeless tobacco: Never  ?Vaping Use  ? Vaping Use: Never used  ?Substance and Sexual Activity  ? Alcohol use: Not Currently  ?  Comment: occ.  ? Drug use: Never  ? Sexual activity: Not on file  ?Other Topics Concern  ? Not on file  ?Social History Narrative  ? Not on file  ? ?Social Determinants of Health  ? ?Financial Resource Strain: Not on file  ?Food Insecurity: Not on file  ?Transportation Needs: Not on file  ?Physical Activity: Not on file  ?Stress: Not on file  ?Social Connections: Not on file  ?Intimate Partner Violence: Not on file  ? ? ? ? ?Review of systems: ?All other review of systems negative except as mentioned in the HPI. ? ? ?Physical Exam: ?Vitals:  ? 03/14/21 0928  ?BP: (!) 100/58  ?Pulse: 65  ? ?Body mass index is 22.47 kg/m?. ?Gen:      No acute distress ?HEENT:  sclera anicteric ?Abd:      soft, non-tender; no palpable masses, no distension ?Ext:    No edema ?Neuro: alert and oriented x 3 ?Psych: normal mood and affect ? ?Data Reviewed: ? ?Reviewed labs, radiology imaging, old records and pertinent past GI work up ? ? ?Assessment and Plan/Recommendations: ? ?61 year old very pleasant female with history of breast cancer s/p bilateral mastectomy, ulcerative pancolitis initially diagnosed in 1992 on Entyvio infusion every 8 weeks and Lialda ? ?Overall symptoms are stable ?Continue Lialda 4.8 g daily and Entyvio maintenance dose every 8 weeks ?  ?Due for surveillance colonoscopy Feb 2023, will schedule it ?The risks and benefits as well as alternatives of endoscopic procedure(s) have been discussed and reviewed. All  questions answered. The patient agrees to proceed. ? ?  ?Health maintenance up-to-date ?  ?Return in 12 months or sooner if needed ? ?This visit required 40 minutes of patient care (this includes precharting, chart review, review of results, face-to-face time used for counseling as well as treatment plan and follow-up. The patient was provided an opportunity to ask questions and all were answered. The patient agreed with the plan and demonstrated an understanding of the instructions. ? ?K. Denzil Magnuson , MD ?  ? ?CC: Ginger Organ., MD ? ? ?

## 2021-03-14 NOTE — Patient Instructions (Signed)
You have been scheduled for a colonoscopy. Please follow written instructions given to you at your visit today.  ?Please pick up your prep supplies at the pharmacy within the next 1-3 days. ?If you use inhalers (even only as needed), please bring them with you on the day of your procedure.  ? ?We have sent the following medications to your pharmacy for you to pick up at your convenience:  Zofran and lialda  ? ?Follow up in 1 year ? ?If you are age 63 or older, your body mass index should be between 23-30. Your Body mass index is 22.47 kg/m?Marland Kitchen If this is out of the aforementioned range listed, please consider follow up with your Primary Care Provider. ? ?If you are age 3 or younger, your body mass index should be between 19-25. Your Body mass index is 22.47 kg/m?Marland Kitchen If this is out of the aformentioned range listed, please consider follow up with your Primary Care Provider.  ? ?________________________________________________________ ? ?The [facility] providers would like to encourage you to use [facility] to communicate with providers for non-urgent requests or questions.  Due to long hold times on the telephone, sending your provider a message by [facility] may be a faster and more efficient way to get a response.  Please allow 48 business hours for a response.  Please remember that this is for non-urgent requests.  ?_______________________________________________________  ? ?Due to recent changes in healthcare laws, you may see the results of your imaging and laboratory studies on MyChart before your provider has had a chance to review them.  We understand that in some cases there may be results that are confusing or concerning to you. Not all laboratory results come back in the same time frame and the provider may be waiting for multiple results in order to interpret others.  Please give [place] 48 hours in order for your provider to thoroughly review all the results before contacting the office for clarification of your  results.   ? ?Thank you for choosing [facility] ? ?Kavitha Nandigam,MD  ?

## 2021-03-15 ENCOUNTER — Encounter: Payer: Self-pay | Admitting: Gastroenterology

## 2021-04-05 ENCOUNTER — Ambulatory Visit
Admission: RE | Admit: 2021-04-05 | Discharge: 2021-04-05 | Disposition: A | Discharge status: Home / Self Care | Payer: BC Managed Care – PPO | Source: Ambulatory Visit | Attending: Internal Medicine | Admitting: Internal Medicine

## 2021-04-05 DIAGNOSIS — F17201 Nicotine dependence, unspecified, in remission: Secondary | ICD-10-CM

## 2021-04-18 ENCOUNTER — Ambulatory Visit: Payer: BC Managed Care – PPO

## 2021-05-02 ENCOUNTER — Encounter: Payer: Self-pay | Admitting: Gastroenterology

## 2021-05-09 ENCOUNTER — Encounter: Payer: Self-pay | Admitting: Gastroenterology

## 2021-05-09 ENCOUNTER — Ambulatory Visit (AMBULATORY_SURGERY_CENTER): Payer: BC Managed Care – PPO | Admitting: Gastroenterology

## 2021-05-09 VITALS — BP 134/72 | HR 63 | Temp 97.8°F | Resp 8 | Ht 64.0 in | Wt 134.0 lb

## 2021-05-09 DIAGNOSIS — K5180 Other ulcerative colitis without complications: Secondary | ICD-10-CM | POA: Diagnosis not present

## 2021-05-09 DIAGNOSIS — Z0389 Encounter for observation for other suspected diseases and conditions ruled out: Secondary | ICD-10-CM | POA: Diagnosis not present

## 2021-05-09 MED ORDER — SODIUM CHLORIDE 0.9 % IV SOLN: 500.0000 mL | Freq: Once | INTRAVENOUS | Status: DC

## 2021-05-09 NOTE — Op Note (Signed)
[facility] ?Patient Name: Carly Cook ?Procedure Date: 05/09/2021 8:28 AM ?MRN: [id number] ?Endoscopist: Mauri Pole , MD ?Age: 61 ?Referring MD:  ?Date of Birth: 08-Aug-1960 ?Gender: Female ?Account #: [id number] ?Procedure:                Colonoscopy ?Indications:              High risk colon cancer surveillance: Ulcerative  ?                          pancolitis of 8 (or more) years duration ?Medicines:                Monitored Anesthesia Care ?Procedure:                Pre-Anesthesia Assessment: ?                          - Prior to the procedure, a History and Physical  ?                          was performed, and patient medications and  ?                          allergies were reviewed. The patient's tolerance of  ?                          previous anesthesia was also reviewed. The risks  ?                          and benefits of the procedure and the sedation  ?                          options and risks were discussed with the patient.  ?                          All questions were answered, and informed consent  ?                          was obtained. Prior Anticoagulants: The patient has  ?                          taken no previous anticoagulant or antiplatelet  ?                          agents. ASA Grade Assessment: II - A patient with  ?                          mild systemic disease. After reviewing the risks  ?                          and benefits, the patient was deemed in  ?                          satisfactory condition to undergo the procedure. ?  After obtaining informed consent, the colonoscope  ?                          was passed under direct vision. Throughout the  ?                          procedure, the patient's blood pressure, pulse, and  ?                          oxygen saturations were monitored continuously. The  ?                          [id number]-HQ190L Colonoscope was introduced through the  ?                          anus and  advanced to the the cecum, identified by  ?                          appendiceal orifice and ileocecal valve. The  ?                          colonoscopy was performed without difficulty. The  ?                          patient tolerated the procedure well. The quality  ?                          of the bowel preparation was excellent. The  ?                          terminal ileum, ileocecal valve, appendiceal  ?                          orifice, and rectum were photographed. ?Scope In: 8:49:42 AM ?Scope Out: 9:03:57 AM ?Scope Withdrawal Time: 0 hours 9 minutes 29 seconds  ?Total Procedure Duration: 0 hours 14 minutes 15 seconds  ?Findings:                 The perianal and digital rectal examinations were  ?                          normal. ?                          The terminal ileum appeared normal. ?                          Inflammation was not found based on the endoscopic  ?                          appearance of the mucosa in the colon. This was  ?                          graded as Mayo Score 0 (normal or inactive  ?  disease), and when compared to the previous  ?                          examination, the findings are in remission.  ?                          Biopsies were taken with a cold forceps for  ?                          histology. ?                          Scattered small-mouthed diverticula were found in  ?                          the sigmoid colon and ascending colon. ?                          Non-bleeding external and internal hemorrhoids were  ?                          found during retroflexion. The hemorrhoids were  ?                          small. ?Complications:            No immediate complications. ?Estimated Blood Loss:     Estimated blood loss was minimal. ?Impression:               - The examined portion of the ileum was normal. ?                          - Inactive (Mayo Score 0) ulcerative colitis, in  ?                          remission, in remission  since the last examination.  ?                          Biopsied. ?                          - Diverticulosis in the sigmoid colon and in the  ?                          ascending colon. ?                          - Non-bleeding external and internal hemorrhoids. ?Recommendation:           - Patient has a contact number available for  ?                          emergencies. The signs and symptoms of potential  ?                          delayed complications were discussed with the  ?  patient. Return to normal activities tomorrow.  ?                          Written discharge instructions were provided to the  ?                          patient. ?                          - Resume previous diet. ?                          - Continue present medications. ?                          - Await pathology results. ?                          - Repeat colonoscopy in 3 - 5 years for  ?                          surveillance based on pathology results. ?                          - Return to GI clinic in 6 months. ?Mauri Pole, MD ?05/09/2021 9:10:08 AM ?This report has been signed electronically. ?

## 2021-05-09 NOTE — Progress Notes (Signed)
Called to room to assist during endoscopic procedure.  Patient ID and intended procedure confirmed with present staff. Received instructions for my participation in the procedure from the performing physician.  

## 2021-05-09 NOTE — Progress Notes (Signed)
Report to PACU, RN, vss, BBS= Clear.  

## 2021-05-09 NOTE — Progress Notes (Signed)
Pt's states no medical or surgical changes since previsit or office visit. VS assessed by [place] ?

## 2021-05-09 NOTE — Progress Notes (Signed)
[facility] History and Physical ? ? ?Primary Care Physician:  Ginger Organ., MD ? ? ?Reason for Procedure:  Ulcerative colitis surveillance ? ?Plan:    Colonoscopy with possible interventions as needed ? ? ? ? ?HPI: Carly Cook is a very pleasant 61 y.o. female here for colonoscopy for Ulcerative colitis surveillance. ?Denies any nausea, vomiting, abdominal pain, melena or bright red blood per rectum ? ?Please refer to office visit 03/14/21 for additional details ? ?The risks and benefits as well as alternatives of endoscopic procedure(s) have been discussed and reviewed. All questions answered. The patient agrees to proceed. ? ? ? ?Past Medical History:  ?Diagnosis Date  ? Breast cancer ([place]) [id number]  ? sx only  ? DJD (degenerative joint disease), lumbar   ? Ductal carcinoma in situ (DCIS) of right breast   ? Emphysema of lung ([place])   ? Per patient, she is early stage emphysema due to a 66 year smoking history  ? Hypercholesterolemia   ? Ulcerative colitis ([place])   ? ? ?Past Surgical History:  ?Procedure Laterality Date  ? COLONOSCOPY  last 12/2014  ? FOOT FOREIGN BODY REMOVAL Right   ? FOOT FRACTURE SURGERY Left   ? SIMPLE MASTECTOMY Right   ? 5 sentinel lymph nodes removed  ? SIMPLE MASTECTOMY Left   ? 1 sentinel lymph node removed  ? ? ?Prior to Admission medications   ?Medication Sig Start Date End Date Taking? Authorizing Provider  ?CALCIUM CITRATE PO Take 800 mg by mouth daily.   Yes [provider]  ?Cholecalciferol (VITAMIN D PO) Take 5,000 Units by mouth daily.    Yes [provider]  ?Cyanocobalamin (B-12 PO) Take by mouth.   Yes [provider]  ?estradiol (ESTRACE) 0.1 MG/GM vaginal cream SMARTSIG:sparingly Topical As Directed 02/22/21  Yes [provider]  ?levothyroxine (SYNTHROID) 50 MCG tablet Take 50 mcg by mouth every morning. 04/13/20  Yes [provider]  ?MAGNESIUM BISGLYCINATE PO Take 400 mg by mouth daily.   Yes [provider]  ?mesalamine (LIALDA) 1.2 g EC tablet TAKE 4 TABLETS DAILY WITH BREAKFAST. 03/14/21  Yes Chinonso Linker, Venia Minks, MD  ?ondansetron (ZOFRAN) 4 MG tablet Take your Zofran with your colonoscopy prep as directed by the doctor 03/14/21  Yes Mauri Pole, MD  ?rosuvastatin (CRESTOR) 10 MG tablet  02/05/18  Yes [provider]  ?solifenacin (VESICARE) 5 MG tablet  02/22/21  Yes [provider]  ?UNABLE TO FIND Take 200 mcg by mouth daily. Med Name: 1-K2 ( MK-7)   Yes [provider]  ?vedolizumab (ENTYVIO) 300 MG injection Infuse every 8 weeks per protocol 11/13/20  Yes Morrell Fluke, Venia Minks, MD  ?VITAMIN K PO Take by mouth.   Yes [provider]  ?zolpidem (AMBIEN) 5 MG tablet zolpidem 5 mg tablet   Yes [provider]  ? ? ?Current Outpatient Medications  ?Medication Sig Dispense Refill  ? CALCIUM CITRATE PO Take 800 mg by mouth daily.    ? Cholecalciferol (VITAMIN D PO) Take 5,000 Units by mouth daily.     ? Cyanocobalamin (B-12 PO) Take by mouth.    ? estradiol (ESTRACE) 0.1 MG/GM vaginal cream SMARTSIG:sparingly Topical As Directed    ? levothyroxine (SYNTHROID) 50 MCG tablet Take 50 mcg by mouth every morning.    ? MAGNESIUM BISGLYCINATE PO Take 400 mg by mouth daily.    ? mesalamine (LIALDA) 1.2 g EC tablet TAKE 4 TABLETS DAILY WITH BREAKFAST. 120 tablet  11  ? ondansetron (ZOFRAN) 4 MG tablet Take your Zofran with your colonoscopy prep as directed by the doctor 4 tablet 0  ? rosuvastatin (CRESTOR) 10 MG tablet     ? solifenacin (VESICARE) 5 MG tablet     ? UNABLE TO FIND Take 200 mcg by mouth daily. Med Name: 1-K2 ( MK-7)    ? vedolizumab (ENTYVIO) 300 MG injection Infuse every 8 weeks per protocol 1 each 11  ? VITAMIN K PO Take by mouth.    ? zolpidem (AMBIEN) 5 MG tablet zolpidem 5 mg tablet    ? ?Current Facility-Administered Medications  ?Medication Dose Route Frequency Provider Last Rate Last Admin  ? 0.9 %  sodium chloride infusion  500 mL Intravenous Once  Verlyn Dannenberg, Venia Minks, MD      ? ? ?Allergies as of 05/09/2021 - Review Complete 05/09/2021  ?Allergen Reaction Noted  ? Norco [hydrocodone-acetaminophen] Other (See Comments) 03/16/2017  ? Remicade [infliximab]  08/31/2018  ? Erythromycin Rash 03/16/2017  ? ? ?Family History  ?Problem Relation Age of Onset  ? Diabetes Father   ? Osteoporosis Mother   ? High blood pressure Mother   ? High blood pressure Sister   ? Colon cancer Neg Hx   ? Colon polyps Neg Hx   ? Esophageal cancer Neg Hx   ? Prostate cancer Neg Hx   ? Rectal cancer Neg Hx   ? Pancreatic cancer Neg Hx   ? ? ?Social History  ? ?Socioeconomic History  ? Marital status: Single  ?  Spouse name: Not on file  ? Number of children: Not on file  ? Years of education: Not on file  ? Highest education level: Not on file  ?Occupational History  ? Occupation: Stage manager  ?Tobacco Use  ? Smoking status: Former  ?  Types: Cigarettes  ?  Quit date: 03/02/2006  ?  Years since quitting: 15.1  ? Smokeless tobacco: Never  ?Vaping Use  ? Vaping Use: Never used  ?Substance and Sexual Activity  ? Alcohol use: Not Currently  ?  Comment: occ.  ? Drug use: Never  ? Sexual activity: Not on file  ?Other Topics Concern  ? Not on file  ?Social History Narrative  ? Not on file  ? ?Social Determinants of Health  ? ?Financial Resource Strain: Not on file  ?Food Insecurity: Not on file  ?Transportation Needs: Not on file  ?Physical Activity: Not on file  ?Stress: Not on file  ?Social Connections: Not on file  ?Intimate Partner Violence: Not on file  ? ? ?Review of Systems: ? ?All other review of systems negative except as mentioned in the HPI. ? ?Physical Exam: ?Vital signs in last 24 hours: ?BP 99/63   Pulse 76   Temp 97.8 ?F (36.6 ?C) (Skin)   Ht 5' 4"  (1.626 m)   Wt 134 lb (60.8 kg)   SpO2 97%   BMI 23.00 kg/m?  ?General:   Alert, NAD ?Lungs:  Clear .   ?Heart:  Regular rate and rhythm ?Abdomen:  Soft, nontender and nondistended. ?Neuro/Psych:  Alert and cooperative. Normal  mood and affect. A and O x 3 ? ?Reviewed labs, radiology imaging, old records and pertinent past GI work up ? ?Patient is appropriate for planned procedure(s) and anesthesia in an ambulatory setting ? ? ?K. Denzil Magnuson , MD ?[phone]  ? ? ?  ?

## 2021-05-09 NOTE — Patient Instructions (Signed)
Handouts on hemorrhoids and diverticulosis given to you today. ? ?Await pathology results from the biopsies taken today. ? ?Resume previous diet and medications. ? ?Return to GI clinic in 6 months. ? ? ?YOU HAD AN ENDOSCOPIC PROCEDURE TODAY AT [facility]:   Refer to the procedure report that was given to you for any specific questions about what was found during the examination.  If the procedure report does not answer your questions, please call your gastroenterologist to clarify.  If you requested that your care partner not be given the details of your procedure findings, then the procedure report has been included in a sealed envelope for you to review at your convenience later. ? ?YOU SHOULD EXPECT: Some feelings of bloating in the abdomen. Passage of more gas than usual.  Walking can help get rid of the air that was put into your GI tract during the procedure and reduce the bloating. If you had a lower endoscopy (such as a colonoscopy or flexible sigmoidoscopy) you may notice spotting of blood in your stool or on the toilet paper. If you underwent a bowel prep for your procedure, you may not have a normal bowel movement for a few days. ? ?Please Note:  You might notice some irritation and congestion in your nose or some drainage.  This is from the oxygen used during your procedure.  There is no need for concern and it should clear up in a day or so. ? ?SYMPTOMS TO REPORT IMMEDIATELY: ? ?Following lower endoscopy (colonoscopy or flexible sigmoidoscopy): ? Excessive amounts of blood in the stool ? Significant tenderness or worsening of abdominal pains ? Swelling of the abdomen that is new, acute ? Fever of 100?F or higher ? ? ?For urgent or emergent issues, a gastroenterologist can be reached at any hour by calling [phone]. ?Do not use MyChart messaging for urgent concerns.  ? ? ?DIET:  We do recommend a small meal at first, but then you may proceed to your regular diet.  Drink plenty of  fluids but you should avoid alcoholic beverages for 24 hours. ? ?ACTIVITY:  You should plan to take it easy for the rest of today and you should NOT DRIVE or use heavy machinery until tomorrow (because of the sedation medicines used during the test).   ? ?FOLLOW UP: ?Our staff will call the number listed on your records 48-72 hours following your procedure to check on you and address any questions or concerns that you may have regarding the information given to you following your procedure. If we do not reach you, we will leave a message.  We will attempt to reach you two times.  During this call, we will ask if you have developed any symptoms of COVID 19. If you develop any symptoms (ie: fever, flu-like symptoms, shortness of breath, cough etc.) before then, please call [phone].  If you test positive for Covid 19 in the 2 weeks post procedure, please call and report this information to [place].   ? ?If any biopsies were taken you will be contacted by phone or by letter within the next 1-3 weeks.  Please call us at [phone] if you have not heard about the biopsies in 3 weeks.  ? ? ?SIGNATURES/CONFIDENTIALITY: ?You and/or your care partner have signed paperwork which will be entered into your electronic medical record.  These signatures attest to the fact that that the information above on your After Visit Summary has been reviewed and is understood.  Full responsibility of the confidentiality of this discharge information lies with you and/or your care-partner.  ?

## 2021-05-13 ENCOUNTER — Telehealth: Payer: Self-pay

## 2021-05-13 ENCOUNTER — Telehealth: Payer: Self-pay | Admitting: *Deleted

## 2021-05-13 NOTE — Telephone Encounter (Signed)
?  Follow up Call- ? ? ?  05/09/2021  ?  7:46 AM  ?Call back number  ?Post procedure Call Back phone  # [phone]  ?Permission to leave phone message Yes  ?  ?No answer at 2nd attempt follow up phone call.  Left message on voicemail.   ?

## 2021-05-13 NOTE — Telephone Encounter (Signed)
Left message on follow up call. 

## 2021-05-14 ENCOUNTER — Encounter: Payer: Self-pay | Admitting: Gastroenterology

## 2021-06-13 ENCOUNTER — Ambulatory Visit: Payer: BC Managed Care – PPO

## 2021-08-08 ENCOUNTER — Ambulatory Visit: Payer: BC Managed Care – PPO

## 2021-10-03 ENCOUNTER — Ambulatory Visit: Payer: BC Managed Care – PPO

## 2021-11-28 ENCOUNTER — Ambulatory Visit: Payer: BC Managed Care – PPO

## 2021-12-17 ENCOUNTER — Encounter: Payer: Self-pay | Admitting: Gastroenterology

## 2021-12-21 ENCOUNTER — Encounter: Payer: Self-pay | Admitting: Gastroenterology

## 2022-03-14 ENCOUNTER — Other Ambulatory Visit: Payer: BC Managed Care – PPO

## 2022-03-14 ENCOUNTER — Ambulatory Visit: Payer: BC Managed Care – PPO | Admitting: Gastroenterology

## 2022-03-14 ENCOUNTER — Encounter: Payer: Self-pay | Admitting: Gastroenterology

## 2022-03-14 VITALS — BP 126/68 | HR 73 | Ht 63.5 in | Wt 142.4 lb

## 2022-03-14 DIAGNOSIS — K5180 Other ulcerative colitis without complications: Secondary | ICD-10-CM

## 2022-03-14 DIAGNOSIS — T452X1A Poisoning by vitamins, accidental (unintentional), initial encounter: Secondary | ICD-10-CM

## 2022-03-14 DIAGNOSIS — K5100 Ulcerative (chronic) pancolitis without complications: Secondary | ICD-10-CM

## 2022-03-14 MED ORDER — MESALAMINE 1.2 G PO TBEC: 2.4000 g | DELAYED_RELEASE_TABLET | Freq: Every day | ORAL | 11 refills | Status: DC

## 2022-03-14 NOTE — Progress Notes (Signed)
Carly Cook    IE:[id number]    11/08/1960  Primary Care Physician:Shaw, Emily Filbert., MD  Referring Physician: Ginger Organ., MD [address] [place],  [place] [address]   Chief complaint:  Ulcerative Colitis Chief Complaint  Patient presents with   Follow-up    UC      HPI:  62 year old very pleasant female here for follow-up visit for ulcerative colitis  I last saw her on 03/14/2021. At that time she was doing well on Entyvio and mesalamine. She had occasional LLQ discomfort.  Today, she reports feeling well. She had her yearly physical exam on 03/10/2022. She reports having well formed stool typically 3-4x within an hour of having her first BM. She states rarely having other BM later on in the day. She denies bloating.  No abdominal pain, blood in stool, melena, unintentional weight loss or decreased appetite.  Denies taking any fiber supplements but she states having oatmeal for breakfast everyday.   She reports having Covid in September 2023 which effected her BM. She reports that her BM have been back to normal.   She reports stopping vitamin D pills as her recent labs showed elevated levels. She reports taking calcium and magnesium for history of osteoporosis.      GI Hx  Colonoscopy 05/09/2021 - The examined portion of the ileum was normal.  - Inactive (Mayo Score 0) ulcerative colitis, in remission, in remission since the last examination. Biopsied.  - Diverticulosis in the sigmoid colon and in the ascending colon. - Non-bleeding external and internal hemorrhoids  Pathology 05/09/2021   Surgical [P], colon nos, random sites FRAGMENTS OF COLONIC MUCOSA WITH NO SIGNIFICANT MICROSCOPIC ABNORMALITIES. THERE ARE NO DIAGNOSTIC FEATURES OF INFLAMMATORY BOWEL DISEASE, MICROSCOPIC COLITIS AND COLLAGENOUS COLITIS  CT Chest Lung Cancer Screening 04/05/2021 1. Lung-RADS 2, benign appearance or behavior. Continue annual screening with low-dose chest  CT without contrast in 12 months. 2. Aortic atherosclerosis (ICD10-I70.0). Coronary artery calcification. 3. Emphysema (ICD10-J43.9).   Colonoscopy 03/08/2018: showed mild left-sided colitis, pancolonic diverticulosis and hemorrhoids.   She was previously followed by Dr Earle Gell   She was on Remicade previously, but discontinued due to possible systemic lupus like reaction/allergic reaction.  She was treated with prednisone on and off for intermittent UC flares   Colonoscopy January 15, 2006 by Dr. Wynetta Emery showed normal colonic mucosa biopsies, biopsies were obtained.     Colonoscopy December 21, 2006 on pathology had active pan colitis with exudate consistent with chronic active inflammatory bowel disease and superimposed C. Difficile   Colonoscopy October 29, 2009 mild colitis in the distal sigmoid colon with random colon biopsies showed chronic active colitis with surface erosion in the rectosigmoid biopsies otherwise rest of the colon with no evidence of active inflammation   Abdominal ultrasound November 19, 2010 showed minimally dilated common duct and mild fatty liver   Colonoscopy January 02, 2012 in [place] showed abnormal vascularity and erythema in the sigmoid and descending colon.  A 4 mm hyperplastic polyp was removed from proximal rectum.   Colonoscopy December 29, 2014 in [place] showed erythema and granularity in the descending and sigmoid colon, recommended repeat colonoscopy in 3 years     Current Outpatient Medications:    CALCIUM CITRATE PO, Take 800 mg by mouth daily., Disp: , Rfl:    Cholecalciferol (VITAMIN D PO), Take 5,000 Units by mouth daily. , Disp: , Rfl:  Cyanocobalamin (B-12 PO), Take by mouth., Disp: , Rfl:    estradiol (ESTRACE) 0.1 MG/GM vaginal cream, SMARTSIG:sparingly Topical As Directed, Disp: , Rfl:    levothyroxine (SYNTHROID) 50 MCG tablet, Take 50 mcg by mouth every morning., Disp: , Rfl:    MAGNESIUM  BISGLYCINATE PO, Take 400 mg by mouth daily., Disp: , Rfl:    mesalamine (LIALDA) 1.2 g EC tablet, TAKE 4 TABLETS DAILY WITH BREAKFAST., Disp: 120 tablet, Rfl: 11   mirabegron ER (MYRBETRIQ) 25 MG TB24 tablet, Take 1 tablet every day by oral route for 30 days., Disp: , Rfl:    rosuvastatin (CRESTOR) 10 MG tablet, , Disp: , Rfl:    UNABLE TO FIND, Take 200 mcg by mouth daily. Med Name: 1-K2 ( MK-7), Disp: , Rfl:    vedolizumab (ENTYVIO) 300 MG injection, Infuse every 8 weeks per protocol, Disp: 1 each, Rfl: 11   VITAMIN K PO, Take by mouth., Disp: , Rfl:    zolpidem (AMBIEN) 5 MG tablet, zolpidem 5 mg tablet, Disp: , Rfl:     Allergies as of 03/14/2022 - Review Complete 03/14/2022  Allergen Reaction Noted   Norco [hydrocodone-acetaminophen] Other (See Comments) 03/16/2017   Remicade [infliximab]  08/31/2018   Erythromycin Rash 03/16/2017    Past Medical History:  Diagnosis Date   Breast cancer ([place]) 2015,2016   sx only   DJD (degenerative joint disease), lumbar    Ductal carcinoma in situ (DCIS) of right breast    Emphysema of lung ([place])    Per patient, she is early stage emphysema due to a 25 year smoking history   Hypercholesterolemia    Ulcerative colitis ([place])     Past Surgical History:  Procedure Laterality Date   COLONOSCOPY  last 12/2014   FOOT FOREIGN BODY REMOVAL Right    FOOT FRACTURE SURGERY Left    SIMPLE MASTECTOMY Right    5 sentinel lymph nodes removed   SIMPLE MASTECTOMY Left    1 sentinel lymph node removed    Family History  Problem Relation Age of Onset   Diabetes Father    Osteoporosis Mother    High blood pressure Mother    High blood pressure Sister    Colon cancer Neg Hx    Colon polyps Neg Hx    Esophageal cancer Neg Hx    Prostate cancer Neg Hx    Rectal cancer Neg Hx    Pancreatic cancer Neg Hx     Social History   Socioeconomic History   Marital status: Single    Spouse name: Not on file   Number of children: Not on file   Years  of education: Not on file   Highest education level: Not on file  Occupational History   Occupation: Stage manager  Tobacco Use   Smoking status: Former    Types: Cigarettes    Quit date: 03/02/2006    Years since quitting: 16.0   Smokeless tobacco: Never  Vaping Use   Vaping Use: Never used  Substance and Sexual Activity   Alcohol use: Not Currently    Comment: occ.   Drug use: Never   Sexual activity: Not on file  Other Topics Concern   Not on file  Social History Narrative   Not on file   Social Determinants of Health   Financial Resource Strain: Not on file  Food Insecurity: Not on file  Transportation Needs: Not on file  Physical Activity: Not on file  Stress: Not on file  Social Connections:  Not on file  Intimate Partner Violence: Not on file      Review of systems: Review of Systems  Gastrointestinal:        -bloating      Physical Exam: General: well-appearing Eyes: sclera anicteric, no redness ENT: oral mucosa moist without lesions, no cervical or supraclavicular lymphadenopathy CV: RRR, no JVD, no peripheral edema Resp: clear to auscultation bilaterally, normal RR and effort noted GI: soft, no tenderness, with active bowel sounds. No guarding or palpable organomegaly noted. Skin; warm and dry, no rash or jaundice noted Neuro: awake, alert and oriented x 3. Normal gross motor function and fluent speech   Data Reviewed:  Reviewed labs, radiology imaging, old records and pertinent past GI work up   Assessment and Plan/Recommendations:  61 year old very pleasant female with history of breast cancer s/p bilateral mastectomy, ulcerative pancolitis initially diagnosed in 1992 on Entyvio infusion every 8 weeks and Lialda  Overall symptoms are stable, inactive disease on recent colonoscopy Entyvio maintenance dose every 8 weeks Decrease Lialda to 2.4 g daily  She is up-to-date with surveillance colonoscopy   Check TB QuantiFERON gold otherwise  health maintenance up-to-date  Elevated vitamin D >100, advised patient to hold vitamin D supplements for a month and then restart lower dose 1000 international units daily.  She was taking 10,000 international units daily previously. Also advised her to hold calcium supplements to prevent hypercalcemia associated with hypervitamin D levels  Will request labs from Dr. Trena Platt office and also recent DEXA scan report  Return in 12 months or sooner if needed  This visit required 30 minutes of patient care (this includes precharting, chart review, review of results, face-to-face time used for counseling as well as treatment plan and follow-up. The patient was provided an opportunity to ask questions and all were answered. The patient agreed with the plan and demonstrated an understanding of the instructions.  I,Safa M Kadhim,acting as a scribe for Harl Bowie, MD.,have documented all relevant documentation on the behalf of Harl Bowie, MD,as directed by  Harl Bowie, MD while in the presence of Harl Bowie, MD.   I, Harl Bowie, MD, have reviewed all documentation for this visit. The documentation on 03/14/22 for the exam, diagnosis, procedures, and orders are all accurate and complete.   Damaris Hippo , MD    CC: Ginger Organ., MD

## 2022-03-14 NOTE — Patient Instructions (Addendum)
Your provider has requested that you go to the basement level for lab work before leaving today. Press "B" on the elevator. The lab is located at the first door on the left as you exit the elevator.   We have sent the following medications to your pharmacy for you to pick up at your convenience: Lialda   Follow up in 1 year  Due to recent changes in healthcare laws, you may see the results of your imaging and laboratory studies on MyChart before your provider has had a chance to review them.  We understand that in some cases there may be results that are confusing or concerning to you. Not all laboratory results come back in the same time frame and the provider may be waiting for multiple results in order to interpret others.  Please give [place] 48 hours in order for your provider to thoroughly review all the results before contacting the office for clarification of your results.    Thank you for choosing [facility]  Karleen Hampshire Nandigam,MD

## 2022-03-16 LAB — QUANTIFERON-TB GOLD PLUS
Mitogen-NIL: >10 IU/mL
NIL: 0.03 IU/mL
QuantiFERON-TB Gold Plus: NEGATIVE
TB1-NIL: 0 IU/mL
TB2-NIL: 0.01 IU/mL

## 2022-04-08 LAB — VITAMIN D, 25-HYDROXY, TOTAL

## 2023-01-27 ENCOUNTER — Other Ambulatory Visit: Payer: 59

## 2023-01-27 ENCOUNTER — Ambulatory Visit: Payer: 59 | Admitting: Gastroenterology

## 2023-01-27 ENCOUNTER — Encounter: Payer: Self-pay | Admitting: Gastroenterology

## 2023-01-27 VITALS — BP 110/70 | HR 100 | Ht 63.5 in | Wt 141.0 lb

## 2023-01-27 DIAGNOSIS — R1032 Left lower quadrant pain: Secondary | ICD-10-CM

## 2023-01-27 DIAGNOSIS — Z8719 Personal history of other diseases of the digestive system: Secondary | ICD-10-CM

## 2023-01-27 DIAGNOSIS — N3010 Interstitial cystitis (chronic) without hematuria: Secondary | ICD-10-CM | POA: Diagnosis not present

## 2023-01-27 DIAGNOSIS — K5100 Ulcerative (chronic) pancolitis without complications: Secondary | ICD-10-CM | POA: Diagnosis not present

## 2023-01-27 DIAGNOSIS — K5900 Constipation, unspecified: Secondary | ICD-10-CM

## 2023-01-27 LAB — COMPREHENSIVE METABOLIC PANEL
ALT: 26 U/L (ref 0–35)
AST: 22 U/L (ref 0–37)
Albumin: 4.8 g/dL (ref 3.5–5.2)
Alkaline Phosphatase: 127 U/L — ABNORMAL HIGH (ref 39–117)
BUN: 14 mg/dL (ref 6–23)
CO2: 30 meq/L (ref 19–32)
Calcium: 10 mg/dL (ref 8.4–10.5)
Chloride: 102 meq/L (ref 96–112)
Creatinine, Ser: 0.59 mg/dL (ref 0.40–1.20)
GFR: 94.94 mL/min (ref >60.00)
Glucose, Bld: 89 mg/dL (ref 70–99)
Potassium: 3.9 meq/L (ref 3.5–5.1)
Sodium: 137 meq/L (ref 135–145)
Total Bilirubin: 0.2 mg/dL (ref 0.2–1.2)
Total Protein: 7.6 g/dL (ref 6.0–8.3)

## 2023-01-27 LAB — CBC WITH DIFFERENTIAL/PLATELET
Basophils Absolute: 0 10*3/uL (ref 0.0–0.1)
Basophils Relative: 0.5 % (ref 0.0–3.0)
Eosinophils Absolute: 0.2 10*3/uL (ref 0.0–0.7)
Eosinophils Relative: 3.7 % (ref 0.0–5.0)
HCT: 40.1 % (ref 36.0–46.0)
Hemoglobin: 13.3 g/dL (ref 12.0–15.0)
Lymphocytes Relative: 35.4 % (ref 12.0–46.0)
Lymphs Abs: 1.7 10*3/uL (ref 0.7–4.0)
MCHC: 33.1 g/dL (ref 30.0–36.0)
MCV: 85.7 fL (ref 78.0–100.0)
Monocytes Absolute: 0.5 10*3/uL (ref 0.1–1.0)
Monocytes Relative: 10.7 % (ref 3.0–12.0)
Neutro Abs: 2.3 10*3/uL (ref 1.4–7.7)
Neutrophils Relative %: 49.7 % (ref 43.0–77.0)
Platelets: 289 10*3/uL (ref 150.0–400.0)
RBC: 4.68 Mil/uL (ref 3.87–5.11)
RDW: 13.4 % (ref 11.5–15.5)
WBC: 4.7 10*3/uL (ref 4.0–10.5)

## 2023-01-27 LAB — C-REACTIVE PROTEIN: CRP: <1 mg/dL (ref 0.5–20.0)

## 2023-01-27 LAB — SEDIMENTATION RATE: Sed Rate: 38 mm/h — ABNORMAL HIGH (ref 0–30)

## 2023-01-27 MED ORDER — HYOSCYAMINE SULFATE 0.125 MG SL SUBL: 0.1250 mg | SUBLINGUAL_TABLET | SUBLINGUAL | 0 refills | Status: DC | PRN

## 2023-01-27 NOTE — Progress Notes (Signed)
 Chief Complaint: Primary GI MD: Dr. Shila  HPI: 63 year old female history of breast cancer s/p bilateral mastectomy, ulcerative pancolitis initially diagnosed in 1992 on Entyvio  infusion every 8 weeks and Lialda , presents for evaluation of  Last seen 03/14/2022 by Dr. Shila. At that time she was doing well on Entyvio  and mesalamine  but she was having occasional LLQ discomfort.  She typically would have 3-4 formed stools per day. QuantiFERON gold March 2024 negative  Patient presents here due to LLQ pain which she is concerned for diverticulitis.  No previous episodes of diverticulitis.  She states she is having severe LLQ pain ongoing since last week and feels like a sharp knife stabbing her.  She is also had some constipation.  She states she thinks may be back around September when she was moving she was having some left-sided pain initially thought to be sciatic pain but looking back she feels it may have been this pain that has now progressively gotten worse.  The patient also reports a change in bowel habits over the past few weeks, with bowel movements becoming less frequent and more difficult to pass. She describes the stools as sluggish and has had to manually assist evacuation. She also reports the presence of an anal fissure and increased flatulence with a foul odor.  The patient notes a recent change in diet due to a diagnosis of interstitial cystitis, which required the elimination of soy products, tomatoes, coffee, and oranges.  She notes her GI symptoms began after this diet change.  The patient denies any fever, nausea, or blood in the stool. She also denies any improvement in symptoms with the use of Dulcolax, which she has since discontinued. She expresses a need for guidance on managing flare-ups and preventing future episodes.      PREVIOUS GI WORKUP   Colonoscopy 05/09/2021 - The examined portion of the ileum was normal.  - Inactive (Mayo Score 0) ulcerative  colitis, in remission, in remission since the last examination. Biopsied.  - Diverticulosis in the sigmoid colon and in the ascending colon. - Non-bleeding external and internal hemorrhoids   Pathology 05/09/2021   Surgical [P], colon nos, random sites FRAGMENTS OF COLONIC MUCOSA WITH NO SIGNIFICANT MICROSCOPIC ABNORMALITIES. THERE ARE NO DIAGNOSTIC FEATURES OF INFLAMMATORY BOWEL DISEASE, MICROSCOPIC COLITIS AND COLLAGENOUS COLITIS   CT Chest Lung Cancer Screening 04/05/2021 1. Lung-RADS 2, benign appearance or behavior. Continue annual screening with low-dose chest CT without contrast in 12 months. 2. Aortic atherosclerosis (ICD10-I70.0). Coronary artery calcification. 3. Emphysema (ICD10-J43.9).    Colonoscopy 03/08/2018: showed mild left-sided colitis, pancolonic diverticulosis and hemorrhoids.   She was previously followed by Dr Gladis Louder   She was on Remicade previously, but discontinued due to possible systemic lupus like reaction/allergic reaction.  She was treated with prednisone on and off for intermittent UC flares   Colonoscopy January 15, 2006 by Dr. Louder showed normal colonic mucosa biopsies, biopsies were obtained.     Colonoscopy December 21, 2006 on pathology had active pan colitis with exudate consistent with chronic active inflammatory bowel disease and superimposed C. Difficile   Colonoscopy October 29, 2009 mild colitis in the distal sigmoid colon with random colon biopsies showed chronic active colitis with surface erosion in the rectosigmoid biopsies otherwise rest of the colon with no evidence of active inflammation   Abdominal ultrasound November 19, 2010 showed minimally dilated common duct and mild fatty liver   Colonoscopy January 02, 2012 in [facility]  showed abnormal vascularity and erythema in  the sigmoid and descending colon.  A 4 mm hyperplastic polyp was removed from proximal rectum.   Colonoscopy December 29, 2014 in [place] showed erythema and granularity in the descending and sigmoid colon, recommended repeat colonoscopy in 3 years    Past Medical History:  Diagnosis Date   Breast cancer (HCC) [phone]   sx only   DJD (degenerative joint disease), lumbar    Ductal carcinoma in situ (DCIS) of right breast    Emphysema of lung (HCC)    Per patient, she is early stage emphysema due to a 33 year smoking history   Hypercholesterolemia    Ulcerative colitis (HCC)     Past Surgical History:  Procedure Laterality Date   COLONOSCOPY  last 12/2014   FOOT FOREIGN BODY REMOVAL Right    FOOT FRACTURE SURGERY Left    SIMPLE MASTECTOMY Right    5 sentinel lymph nodes removed   SIMPLE MASTECTOMY Left    1 sentinel lymph node removed    Current Outpatient Medications  Medication Sig Dispense Refill   amitriptyline (ELAVIL) 10 MG tablet Take 10 mg by mouth at bedtime.     CALCIUM CITRATE PO Take 800 mg by mouth daily.     Cholecalciferol (VITAMIN D  PO) Take 5,000 Units by mouth daily.      Cyanocobalamin (B-12 PO) Take by mouth.     estradiol (ESTRACE) 0.1 MG/GM vaginal cream SMARTSIG:sparingly Topical As Directed     hyoscyamine  (LEVSIN  SL) 0.125 MG SL tablet Place 1 tablet (0.125 mg total) under the tongue every 4 (four) hours as needed. 30 tablet 0   levothyroxine (SYNTHROID) 50 MCG tablet Take 50 mcg by mouth every morning.     MAGNESIUM BISGLYCINATE PO Take 400 mg by mouth daily.     mesalamine  (LIALDA ) 1.2 g EC tablet Take 2 tablets (2.4 g total) by mouth daily with breakfast. 60 tablet 11   rosuvastatin (CRESTOR) 10 MG tablet      UNABLE TO FIND Take 200 mcg by mouth daily. Med Name: 1-K2 ( MK-7)     vedolizumab  (ENTYVIO ) 300 MG injection Infuse every 8 weeks per protocol 1 each 11   VITAMIN K PO Take by mouth.     zolpidem (AMBIEN) 5 MG tablet zolpidem 5 mg tablet     mirabegron ER (MYRBETRIQ) 25 MG TB24 tablet Take 1 tablet every day by oral route for 30 days.     No current  facility-administered medications for this visit.    Allergies as of 01/27/2023 - Review Complete 01/27/2023  Allergen Reaction Noted   Norco [hydrocodone -acetaminophen ] Other (See Comments) 03/16/2017   Remicade [infliximab]  08/31/2018   Erythromycin Rash 03/16/2017    Family History  Problem Relation Age of Onset   Diabetes Father    Osteoporosis Mother    High blood pressure Mother    High blood pressure Sister    Colon cancer Neg Hx    Colon polyps Neg Hx    Esophageal cancer Neg Hx    Prostate cancer Neg Hx    Rectal cancer Neg Hx    Pancreatic cancer Neg Hx     Social History   Socioeconomic History   Marital status: Single    Spouse name: Not on file   Number of children: Not on file   Years of education: Not on file   Highest education level: Not on file  Occupational History   Occupation: secretary/administrator  Tobacco Use   Smoking status: Former  Current packs/day: 0.00    Types: Cigarettes    Quit date: 03/02/2006    Years since quitting: 16.9   Smokeless tobacco: Never  Vaping Use   Vaping status: Never Used  Substance and Sexual Activity   Alcohol use: Not Currently    Comment: occ.   Drug use: Never   Sexual activity: Not on file  Other Topics Concern   Not on file  Social History Narrative   Not on file   Social Drivers of Health   Financial Resource Strain: Not on file  Food Insecurity: Not on file  Transportation Needs: Not on file  Physical Activity: Not on file  Stress: Not on file  Social Connections: Not on file  Intimate Partner Violence: Not on file    Review of Systems:    Constitutional: No weight loss, fever, chills, weakness or fatigue HEENT: Eyes: No change in vision               Ears, Nose, Throat:  No change in hearing or congestion Skin: No rash or itching Cardiovascular: No chest pain, chest pressure or palpitations   Respiratory: No SOB or cough Gastrointestinal: See HPI and otherwise negative Genitourinary: No  dysuria or change in urinary frequency Neurological: No headache, dizziness or syncope Musculoskeletal: No new muscle or joint pain Hematologic: No bleeding or bruising Psychiatric: No history of depression or anxiety    Physical Exam:  Vital signs: BP 110/70   Pulse 100   Ht 5' 3.5 (1.613 m)   Wt 141 lb (64 kg)   BMI 24.59 kg/m   Constitutional: NAD, Well developed, Well nourished, alert and cooperative Head:  Normocephalic and atraumatic. Eyes:   PEERL, EOMI. No icterus. Conjunctiva pink. Respiratory: Respirations even and unlabored. Lungs clear to auscultation bilaterally.   No wheezes, crackles, or rhonchi.  Cardiovascular:  Regular rate and rhythm. No peripheral edema, cyanosis or pallor.  Gastrointestinal:  Soft, nondistended, nontender. No rebound or guarding. Normal bowel sounds. No appreciable masses or hepatomegaly. Rectal:  Not performed.  Patient declined. Msk:  Symmetrical without gross deformities. Without edema, no deformity or joint abnormality.  Neurologic:  Alert and  oriented x4;  grossly normal neurologically.  Skin:   Dry and intact without significant lesions or rashes. Psychiatric: Oriented to person, place and time. Demonstrates good judgement and reason without abnormal affect or behaviors.  RELEVANT LABS AND IMAGING: CBC    Component Value Date/Time   WBC 5.6 04/10/2020 1629   RBC 4.62 04/10/2020 1629   HGB 12.9 04/10/2020 1629   HCT 38.7 04/10/2020 1629   PLT 262.0 04/10/2020 1629   MCV 83.7 04/10/2020 1629   MCHC 33.3 04/10/2020 1629   RDW 12.9 04/10/2020 1629   LYMPHSABS 2.2 04/10/2020 1629   MONOABS 0.5 04/10/2020 1629   EOSABS 0.1 04/10/2020 1629   BASOSABS 0.0 04/10/2020 1629    CMP     Component Value Date/Time   NA 138 04/10/2020 1629   K 3.8 04/10/2020 1629   CL 105 04/10/2020 1629   CO2 23 04/10/2020 1629   GLUCOSE 91 04/10/2020 1629   BUN 7 04/10/2020 1629   CREATININE 0.48 04/10/2020 1629   CALCIUM 9.5 04/10/2020 1629    PROT 7.3 04/10/2020 1629   ALBUMIN  4.5 04/10/2020 1629   AST 17 04/10/2020 1629   ALT 23 04/10/2020 1629   ALKPHOS 59 04/10/2020 1629   BILITOT 0.2 04/10/2020 1629     Assessment/Plan:      Left lower quadrant pain  New onset, severe, sharp, stabbing pain in the left lower quadrant and back. No fever or nausea. Constipation with hard stools and straining. History of ulcerative colitis and diverticulosis. Differential includes diverticulitis, constipation, and inflammatory bowel disease flare.  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  Could also be functional constipation with description of manual assistance.  Patient declined rectal exam today unfortunately. --Order CT abdomen/pelvis to rule out diverticulitis and assess for other causes of pain. --Order labs including CBC, CMP, and inflammatory markers. --Order fecal calprotectin --Prescribe Miralax daily for constipation. --Prescribe Levsin  as needed for pain.  Ulcerative Colitis Chronic condition, last colonoscopy in April 2023. No current symptoms of flare. On Entyvio  and Lialda  --Continue current management. --if the above diagnostics indicated IBD flare will consider prednisone taper  Interstitial Cystitis Diagnosed in September, diet modification implemented. No current symptoms. -Continue current management.  Nestor Mollie RIGGERS [facility] 01/27/2023, 3:16 PM  Cc: Loreli Elsie JONETTA Mickey., MD

## 2023-01-27 NOTE — Patient Instructions (Signed)
 Your provider has requested that you go to the basement level for lab work before leaving today. Press B on the elevator. The lab is located at the first door on the left as you exit the elevator.  Due to recent changes in healthcare laws, you may see the results of your imaging and laboratory studies on MyChart before your provider has had a chance to review them.  We understand that in some cases there may be results that are confusing or concerning to you. Not all laboratory results come back in the same time frame and the provider may be waiting for multiple results in order to interpret others.  Please give us  48 hours in order for your provider to thoroughly review all the results before contacting the office for clarification of your results.   We have sent the following medications to your pharmacy for you to pick up at your convenience: Generic Levsin   Please use over the counter Miralax daily-one dose of 17 grams.  You have been scheduled for a CT scan of the abdomen and pelvis at [facility], 1st floor Radiology. You are scheduled on 01/28/2023 at 6:30pm. You should arrive two and half hours (4:00pm) prior to your appointment time for registration.   The purpose of you drinking the oral contrast is to aid in the visualization of your intestinal tract. The contrast solution may cause some diarrhea. Depending on your individual set of symptoms, you may also receive an intravenous injection of x-ray contrast/dye. If you have any questions regarding your exam or if you need to reschedule, you may call [facility] at [phone] between the hours of 8:00 am and 5:00 pm, Monday-Friday.   Call us  back mid February to set up a mid April appointment with Dr Gustav Mcgee.   I appreciate the opportunity to care for you. Bayley McMichael PA-C

## 2023-01-28 ENCOUNTER — Ambulatory Visit (HOSPITAL_COMMUNITY)
Admission: RE | Admit: 2023-01-28 | Discharge: 2023-01-28 | Disposition: A | Discharge status: Home / Self Care | Payer: 59 | Source: Ambulatory Visit | Attending: Gastroenterology | Admitting: Gastroenterology

## 2023-01-28 ENCOUNTER — Other Ambulatory Visit: Payer: 59

## 2023-01-28 DIAGNOSIS — Z8719 Personal history of other diseases of the digestive system: Secondary | ICD-10-CM | POA: Diagnosis present

## 2023-01-28 DIAGNOSIS — R1032 Left lower quadrant pain: Secondary | ICD-10-CM

## 2023-01-28 DIAGNOSIS — K5900 Constipation, unspecified: Secondary | ICD-10-CM

## 2023-01-28 DIAGNOSIS — K5100 Ulcerative (chronic) pancolitis without complications: Secondary | ICD-10-CM | POA: Insufficient documentation

## 2023-01-28 MED ORDER — IOHEXOL 300 MG/ML  SOLN
30.0000 mL | Freq: Once | INTRAMUSCULAR | Status: AC | PRN
  Administered 2023-01-28: 30 mL via ORAL

## 2023-01-28 MED ORDER — IOHEXOL 300 MG/ML  SOLN
100.0000 mL | Freq: Once | INTRAMUSCULAR | Status: AC | PRN
  Administered 2023-01-28: 100 mL via INTRAVENOUS

## 2023-01-30 LAB — CALPROTECTIN, FECAL: Calprotectin, Fecal: <5 ug/g (ref 0–120)

## 2023-03-20 ENCOUNTER — Other Ambulatory Visit: Payer: Self-pay | Admitting: Gastroenterology

## 2023-07-14 ENCOUNTER — Ambulatory Visit: Admitting: Gastroenterology

## 2023-07-14 ENCOUNTER — Encounter: Payer: Self-pay | Admitting: Gastroenterology

## 2023-07-14 VITALS — BP 102/60 | HR 70 | Ht 64.5 in | Wt 139.1 lb

## 2023-07-14 DIAGNOSIS — K5903 Drug induced constipation: Secondary | ICD-10-CM

## 2023-07-14 DIAGNOSIS — T43205A Adverse effect of unspecified antidepressants, initial encounter: Secondary | ICD-10-CM | POA: Diagnosis not present

## 2023-07-14 DIAGNOSIS — R351 Nocturia: Secondary | ICD-10-CM

## 2023-07-14 DIAGNOSIS — Z79620 Long term (current) use of immunosuppressive biologic: Secondary | ICD-10-CM

## 2023-07-14 DIAGNOSIS — N3010 Interstitial cystitis (chronic) without hematuria: Secondary | ICD-10-CM | POA: Diagnosis not present

## 2023-07-14 DIAGNOSIS — M6289 Other specified disorders of muscle: Secondary | ICD-10-CM

## 2023-07-14 DIAGNOSIS — K5190 Ulcerative colitis, unspecified, without complications: Secondary | ICD-10-CM

## 2023-07-14 DIAGNOSIS — K5902 Outlet dysfunction constipation: Secondary | ICD-10-CM

## 2023-07-14 DIAGNOSIS — K5100 Ulcerative (chronic) pancolitis without complications: Secondary | ICD-10-CM

## 2023-07-14 NOTE — Patient Instructions (Addendum)
 VISIT SUMMARY:  You had a follow-up visit to discuss your ulcerative colitis, constipation, and interstitial cystitis. Your ulcerative colitis is well-controlled, and you have no current issues with constipation. We also discussed your interstitial cystitis and potential treatments to help manage your symptoms.  YOUR PLAN:  -ULCERATIVE COLITIS: Ulcerative colitis is a chronic condition that causes inflammation and sores in the colon and rectum. Your condition is well-controlled with no blood or mucus in your stool and stable bowel movements. You should continue with your Entyvio  infusions every eight weeks. You can stop taking mesalamine  after you finish your current supply. - Recall surveillance colonoscopy in 2026 - Upto date with IBD health maintenance  -CONSTIPATION DUE TO MEDICATION: Your previous constipation was likely caused by the medication amitriptyline, which you have stopped taking. Since stopping the medication, your constipation has resolved. Please monitor your bowel habits and let us  know if constipation returns.  -INTERSTITIAL CYSTITIS: Interstitial cystitis is a chronic condition that causes bladder pressure, bladder pain, and sometimes pelvic pain. It is causing you to urinate frequently at night, which disrupts your sleep. We discussed pelvic floor physical therapy as a non-invasive treatment option to help manage your bladder symptoms and urgency. You will be referred to pelvic floor physical therapy at [facility].  INSTRUCTIONS:  Please continue with your current treatment plan for ulcerative colitis and monitor your bowel habits. You will be referred to pelvic floor physical therapy for your interstitial cystitis. If you have any new symptoms or concerns, please schedule a follow-up appointment.  Due to recent changes in healthcare laws, you may see the results of your imaging and laboratory studies on MyChart before your provider has had a chance to  review them.  We understand that in some cases there may be results that are confusing or concerning to you. Not all laboratory results come back in the same time frame and the provider may be waiting for multiple results in order to interpret others.  Please give us  48 hours in order for your provider to thoroughly review all the results before contacting the office for clarification of your results.    _______________________________________________________  If your blood pressure at your visit was 140/90 or greater, please contact your primary care physician to follow up on this.  _______________________________________________________  If you are age 80 or older, your body mass index should be between 23-30. Your Body mass index is 23.51 kg/m. If this is out of the aforementioned range listed, please consider follow up with your Primary Care Provider.  If you are age 7 or younger, your body mass index should be between 19-25. Your Body mass index is 23.51 kg/m. If this is out of the aformentioned range listed, please consider follow up with your Primary Care Provider.   ________________________________________________________  The [facility] providers would like to encourage you to use MYCHART to communicate with providers for non-urgent requests or questions.  Due to long hold times on the telephone, sending your provider a message by [facility] may be a faster and more efficient way to get a response.  Please allow 48 business hours for a response.  Please remember that this is for non-urgent requests.  _______________________________________________________   I appreciate the  opportunity to care for you  Thank You   Kavitha Nandigam , MD

## 2023-07-14 NOTE — Progress Notes (Unsigned)
 Carly Cook    [id number]    04-30-1960  Primary Care Physician:Shaw, Elsie JONETTA Raddle., MD  Referring Physician: Loreli Elsie JONETTA Raddle., MD [address] [place],  [place] [address]   Chief complaint: Ulcerative colitis  Discussed the use of AI scribe software for clinical note transcription with the patient, who gave verbal consent to proceed.  History of Present Illness Carly Cook is a 63 year old female with ulcerative colitis who presents for a follow-up visit.  Her ulcerative colitis is well-controlled with no blood or mucus in her stool. She has two to three bowel movements per day, which is her baseline. No abdominal pain is present. She is currently on Entyvio  infusions every eight weeks and mesalamine , which she has been taking for fifteen years. Her last fecal calprotectin was undetectable.  In January, she experienced symptoms initially thought to be diverticulitis, but it was determined to be constipation, likely caused by amitriptyline. She stopped taking amitriptyline in April. She tried Miralax and Metamucil, but found Miralax made her stools too loose. She describes a sensation of incomplete evacuation and straining at work, which she attributes to decreased motility from amitriptyline.  She has interstitial cystitis and urinates eight times at night, which disrupts her sleep. She has not tried Botox injections for her bladder due to concerns about the procedure being done while awake. She has not tried pelvic floor physical therapy yet.  She is part of social media groups for interstitial cystitis. She lives near the [place] and has previously used an infusion center on [address], but faced logistical issues with medication preparation timing.  Her last lab work was done at her infusion center, [facility], and was normal. She believes they have also done a TB test.    Colonoscopy 05/09/2021 - The examined portion of the ileum was normal.   - Inactive (Mayo Score 0) ulcerative colitis, in remission, in remission since the last examination. Biopsied.  - Diverticulosis in the sigmoid colon and in the ascending colon. - Non-bleeding external and internal hemorrhoids   Pathology 05/09/2021   Surgical [P], colon nos, random sites FRAGMENTS OF COLONIC MUCOSA WITH NO SIGNIFICANT MICROSCOPIC ABNORMALITIES. THERE ARE NO DIAGNOSTIC FEATURES OF INFLAMMATORY BOWEL DISEASE, MICROSCOPIC COLITIS AND COLLAGENOUS COLITIS   CT Chest Lung Cancer Screening 04/05/2021 1. Lung-RADS 2, benign appearance or behavior. Continue annual screening with low-dose chest CT without contrast in 12 months. 2. Aortic atherosclerosis (ICD10-I70.0). Coronary artery calcification. 3. Emphysema (ICD10-J43.9).    Colonoscopy 03/08/2018: showed mild left-sided colitis, pancolonic diverticulosis and hemorrhoids.   She was previously followed by Dr Gladis Louder   She was on Remicade previously, but discontinued due to possible systemic lupus like reaction/allergic reaction.  She was treated with prednisone on and off for intermittent UC flares   Colonoscopy January 15, 2006 by Dr. Louder showed normal colonic mucosa biopsies, biopsies were obtained.     Colonoscopy December 21, 2006 on pathology had active pan colitis with exudate consistent with chronic active inflammatory bowel disease and superimposed C. Difficile   Colonoscopy October 29, 2009 mild colitis in the distal sigmoid colon with random colon biopsies showed chronic active colitis with surface erosion in the rectosigmoid biopsies otherwise rest of the colon with no evidence of active inflammation   Abdominal ultrasound November 19, 2010 showed minimally dilated common duct and mild fatty liver   Colonoscopy January 02, 2012 in [facility]  showed abnormal vascularity and  erythema in the sigmoid and descending colon.  A 4 mm hyperplastic polyp was removed from proximal rectum.   Colonoscopy  December 29, 2014 in [facility]  showed erythema and granularity in the descending and sigmoid colon, recommended repeat colonoscopy in 3 years  Outpatient Encounter Medications as of 07/14/2023  Medication Sig   vedolizumab  (ENTYVIO ) 300 MG injection 300 mg by Intravenous (Continuous Infusion) route every 8 (eight) weeks.   zolpidem (AMBIEN) 5 MG tablet Take 5 mg by mouth at bedtime as needed.   CALCIUM CITRATE PO Take 800 mg by mouth daily.   Cholecalciferol (VITAMIN D  PO) Take 5,000 Units by mouth daily.    Cyanocobalamin (B-12 PO) Take by mouth.   estradiol (ESTRACE) 0.1 MG/GM vaginal cream SMARTSIG:sparingly Topical As Directed   levothyroxine (SYNTHROID) 50 MCG tablet Take 50 mcg by mouth every morning.   MAGNESIUM BISGLYCINATE PO Take 400 mg by mouth daily.   mesalamine  (LIALDA ) 1.2 g EC tablet Take 2 tablets (2.4 g total) by mouth daily with breakfast.   rosuvastatin (CRESTOR) 10 MG tablet    Turmeric 500 MG CAPS Take 3 capsules by mouth daily.   UNABLE TO FIND Take 200 mcg by mouth daily. Med Name: 1-K2 ( MK-7)   vedolizumab  (ENTYVIO ) 300 MG injection Infuse every 8 weeks per protocol   VITAMIN K PO Take by mouth.   zolpidem (AMBIEN) 5 MG tablet zolpidem 5 mg tablet   [DISCONTINUED] amitriptyline (ELAVIL) 10 MG tablet Take 10 mg by mouth at bedtime.   [DISCONTINUED] hyoscyamine  (LEVSIN  SL) 0.125 MG SL tablet Place 1 tablet (0.125 mg total) under the tongue every 4 (four) hours as needed. (Patient not taking: Reported on 07/14/2023)   [DISCONTINUED] mirabegron ER (MYRBETRIQ) 25 MG TB24 tablet Take 1 tablet every day by oral route for 30 days.   No facility-administered encounter medications on file as of 07/14/2023.    Allergies as of 07/14/2023 - Review Complete 07/14/2023  Allergen Reaction Noted   Norco [hydrocodone-acetaminophen] Other (See Comments) 03/16/2017   Remicade [infliximab]  08/31/2018   Erythromycin Rash 03/16/2017    Past Medical History:  Diagnosis  Date   Breast cancer (HCC) 2015,2016   sx only   DJD (degenerative joint disease), lumbar    Ductal carcinoma in situ (DCIS) of right breast    Emphysema of lung (HCC)    Per patient, she is early stage emphysema due to a 33 year smoking history   Hypercholesterolemia    Ulcerative colitis (HCC)     Past Surgical History:  Procedure Laterality Date   COLONOSCOPY  last 12/2014   FOOT FOREIGN BODY REMOVAL Right    FOOT FRACTURE SURGERY Left    SIMPLE MASTECTOMY Right    5 sentinel lymph nodes removed   SIMPLE MASTECTOMY Left    1 sentinel lymph node removed    Family History  Problem Relation Age of Onset   Diabetes Father    Osteoporosis Mother    High blood pressure Mother    High blood pressure Sister    Colon cancer Neg Hx    Colon polyps Neg Hx    Esophageal cancer Neg Hx    Prostate cancer Neg Hx    Rectal cancer Neg Hx    Pancreatic cancer Neg Hx     Social History   Socioeconomic History   Marital status: Single    Spouse name: Not on file   Number of children: Not on file   Years of education: Not on  file   Highest education level: Not on file  Occupational History   Occupation: Secretary/administrator  Tobacco Use   Smoking status: Former    Current packs/day: 0.00    Types: Cigarettes    Quit date: 03/02/2006    Years since quitting: 17.3   Smokeless tobacco: Never  Vaping Use   Vaping status: Never Used  Substance and Sexual Activity   Alcohol use: Not Currently    Comment: occ.   Drug use: Never   Sexual activity: Not on file  Other Topics Concern   Not on file  Social History Narrative   Not on file   Social Drivers of Health   Financial Resource Strain: Not on file  Food Insecurity: Not on file  Transportation Needs: Not on file  Physical Activity: Not on file  Stress: Not on file  Social Connections: Not on file  Intimate Partner Violence: Not on file      Review of systems: All other review of systems negative except as mentioned in  the HPI.   Physical Exam: Vitals:   07/14/23 0828  BP: 102/60  Pulse: 70  SpO2: 97%   Body mass index is 23.51 kg/m. Gen:      No acute distress HEENT:  sclera anicteric Abd:      soft, non-tender; no palpable masses, no distension Ext:    No edema Neuro: alert and oriented x 3 Psych: normal mood and affect  Data Reviewed:  Reviewed labs, radiology imaging, old records and pertinent past GI work up     Assessment and Plan Assessment & Plan Ulcerative colitis Ulcerative colitis is well-controlled with no blood or mucus in stool and stable bowel movements. Managed with Entyvio  infusions every eight weeks. Last fecal calprotectin was undetectable, indicating well-controlled disease. - Continue Entyvio  infusions every eight weeks - Discontinue mesalamine  after current supply is finished - Recall surveillance colonoscopy in 2026 - Upto date with IBD health maintenance   Constipation due to medication Previous constipation episode attributed to amitriptyline use, which has been discontinued. Constipation resolved after stopping the medication. - Monitor bowel habits and adjust treatment if constipation recurs  Interstitial cystitis Interstitial cystitis causing nocturia with urination up to eight times per night, leading to interrupted sleep. Previous suggestion of Botox treatment was declined. Pelvic floor physical therapy is considered as a non-invasive alternative to help with bladder symptoms and urgency. - Refer to pelvic floor physical therapy at [facility]  Return in 1 year    The patient was provided an opportunity to ask questions and all were answered. The patient agreed with the plan and demonstrated an understanding of the instructions.  LOIS Wilkie Mcgee , MD    CC: Loreli Elsie JONETTA Mickey., MD

## 2023-07-15 ENCOUNTER — Encounter: Payer: Self-pay | Admitting: Gastroenterology

## 2023-10-08 ENCOUNTER — Encounter: Admitting: Physical Therapy

## 2023-10-15 ENCOUNTER — Encounter: Admitting: Physical Therapy

## 2023-10-22 ENCOUNTER — Encounter: Admitting: Physical Therapy

## 2023-10-22 NOTE — Patient Instructions (Signed)
 SURGICAL WAITING ROOM VISITATION Patients having surgery or a procedure may have no more than 2 support people in the waiting area - these visitors may rotate in the visitor waiting room.   Due to an increase in RSV and influenza rates and associated hospitalizations, children ages 79 and under may not visit patients in [facility] hospitals. If the patient needs to stay at the hospital during part of their recovery, the visitor guidelines for inpatient rooms apply.  PRE-OP VISITATION  Pre-op nurse will coordinate an appropriate time for 1 support person to accompany the patient in pre-op.  This support person may not rotate.  This visitor will be contacted when the time is appropriate for the visitor to come back in the pre-op area.  Please refer to the [facility] website for the visitor guidelines for Inpatients (after your surgery is over and you are in a regular room).  You are not required to quarantine at this time prior to your surgery. However, you must do this: Hand Hygiene often Do NOT share personal items Notify your provider if you are in close contact with someone who has COVID or you develop fever 100.4 or greater, new onset of sneezing, cough, sore throat, shortness of breath or body aches.  If you test positive for Covid or have been in contact with anyone that has tested positive in the last 10 days please notify you surgeon.    Your procedure is scheduled on: 11/06/23   Report to [facility] Main Entrance: [place] entrance where the [employer] is available.   Report to admitting at: 11:15 AM  Call this number if you have any questions or problems the morning of surgery [phone]  FOLLOW ANY ADDITIONAL PRE OP INSTRUCTIONS YOU RECEIVED FROM YOUR SURGEON'S OFFICE!!!  Do not eat food after Midnight the night prior to your surgery/procedure.  After Midnight you may have the following liquids until :10:30 AM DAY OF SURGERY  Clear Liquid Diet Water Black  Coffee (sugar ok, NO MILK/CREAM OR CREAMERS)  Tea (sugar ok, NO MILK/CREAM OR CREAMERS) regular and decaf                             Plain Jell-O  with no fruit (NO RED)                                           Fruit ices (not with fruit pulp, NO RED)                                     Popsicles (NO RED)                                                                  Juice: NO CITRUS JUICES: only apple, WHITE grape, WHITE cranberry Sports drinks like Gatorade or Powerade (NO RED)   The day of surgery:  Drink ONE (1) Pre-Surgery Clear Ensure at : 10:30 AM the morning of surgery. Drink in one sitting. Do not sip.  This drink was given  to you during your hospital pre-op appointment visit. Nothing else to drink after completing the Pre-Surgery Clear Ensure or G2 : No candy, chewing gum or throat lozenges.    Oral Hygiene is also important to reduce your risk of infection.        Remember - BRUSH YOUR TEETH THE MORNING OF SURGERY WITH YOUR REGULAR TOOTHPASTE  Do NOT smoke after Midnight the night before surgery.  STOP TAKING all Vitamins, Herbs and supplements 1 week before your surgery.   Take ONLY these medicines the morning of surgery with A SIP OF WATER: mesalamine ,levothyroxine.  If You have been diagnosed with Sleep Apnea - Bring CPAP mask and tubing day of surgery. We will provide you with a CPAP machine on the day of your surgery.                   You may not have any metal on your body including hair pins, jewelry, and body piercing  Do not wear make-up, lotions, powders, perfumes / cologne, or deodorant  Do not wear nail polish including gel and S&S, artificial / acrylic nails, or any other type of covering on natural nails including finger and toenails. If you have artificial nails, gel coating, etc., that needs to be removed by a nail salon, Please have this removed prior to surgery. Not doing so may mean that your surgery could be cancelled or delayed if the Surgeon or  anesthesia staff feels like they are unable to monitor you safely.   Do not shave 48 hours prior to surgery to avoid nicks in your skin which may contribute to postoperative infections.   Contacts, Hearing Aids, dentures or bridgework may not be worn into surgery. DENTURES WILL BE REMOVED PRIOR TO SURGERY PLEASE DO NOT APPLY Poly grip OR ADHESIVES!!!  You may bring a small overnight bag with you on the day of surgery, only pack items that are not valuable. [facility] IS NOT RESPONSIBLE   FOR VALUABLES THAT ARE LOST OR STOLEN.   Patients discharged on the day of surgery will not be allowed to drive home.  Someone NEEDS to stay with you for the first 24 hours after anesthesia.  Do not bring your home medications to the hospital. The Pharmacy will dispense medications listed on your medication list to you during your admission in the Hospital.  Special Instructions: Bring a copy of your healthcare power of attorney and living will documents the day of surgery, if you wish to have them scanned into your [facility] Records- EPIC  Please read over the following fact sheets you were given: IF YOU HAVE QUESTIONS ABOUT YOUR PRE-OP INSTRUCTIONS, PLEASE CALL [phone]   [facility] - Preparing for Surgery      Before surgery, you can play an important role.  Because skin is not sterile, your skin needs to be as free of germs as possible.  You can reduce the number of germs on your skin by washing with CHG (chlorahexidine gluconate) soap before surgery.  CHG is an antiseptic cleaner which kills germs and bonds with the skin to continue killing germs even after washing. Please DO NOT use if you have an allergy to CHG or antibacterial soaps.  If your skin becomes reddened/irritated stop using the CHG and inform your nurse when you arrive at Short Stay. Do not shave (including legs and underarms) for at least 48 hours prior to the first CHG shower.  You may shave your face/neck.  Please follow  these instructions carefully:  1.  Shower with CHG Soap the night before surgery ONLY (DO NOT USE THE SOAP THE MORNING OF SURGERY).  2.  If you choose to wash your hair, wash your hair first as usual with your normal  shampoo.  3.  After you shampoo, rinse your hair and body thoroughly to remove the shampoo.                             4.  Use CHG as you would any other liquid soap.  You can apply chg directly to the skin and wash.  Gently with a scrungie or clean washcloth.  5.  Apply the CHG Soap to your body ONLY FROM THE NECK DOWN.   Do not use on face/ open                           Wound or open sores. Avoid contact with eyes, ears mouth and genitals (private parts).                       Wash face,  Genitals (private parts) with your normal soap.             6.  Wash thoroughly, paying special attention to the area where your  surgery  will be performed.  7.  Thoroughly rinse your body with warm water from the neck down.  8.  DO NOT shower/wash with your normal soap after using and rinsing off the CHG Soap.                9.  Pat yourself dry with a clean towel.            10.  Wear clean pajamas.            11.  Place clean sheets on your bed the night of your first shower and do not  sleep with pets.  Day of Surgery : Do not apply any CHG, lotions/deodorants the morning of surgery.  Please wear clean clothes to the hospital/surgery center.   FAILURE TO FOLLOW THESE INSTRUCTIONS MAY RESULT IN THE CANCELLATION OF YOUR SURGERY  PATIENT SIGNATURE_________________________________  NURSE SIGNATURE__________________________________  ________________________________________________________________________

## 2023-10-23 ENCOUNTER — Encounter (HOSPITAL_COMMUNITY)
Admission: RE | Admit: 2023-10-23 | Discharge: 2023-10-23 | Disposition: A | Discharge status: Home / Self Care | Source: Ambulatory Visit | Attending: Orthopedic Surgery | Admitting: Orthopedic Surgery

## 2023-10-23 ENCOUNTER — Encounter (HOSPITAL_COMMUNITY): Payer: Self-pay

## 2023-10-23 ENCOUNTER — Other Ambulatory Visit: Payer: Self-pay

## 2023-10-23 VITALS — BP 111/63 | HR 65 | Temp 99.0°F | Ht 64.5 in | Wt 130.0 lb

## 2023-10-23 DIAGNOSIS — Z01812 Encounter for preprocedural laboratory examination: Secondary | ICD-10-CM | POA: Insufficient documentation

## 2023-10-23 DIAGNOSIS — Z01818 Encounter for other preprocedural examination: Secondary | ICD-10-CM

## 2023-10-23 HISTORY — DX: Anxiety disorder, unspecified: F41.9

## 2023-10-23 HISTORY — DX: Depression, unspecified: F32.A

## 2023-10-23 HISTORY — DX: Hypothyroidism, unspecified: E03.9

## 2023-10-23 LAB — CBC
HCT: 42.1 % (ref 36.0–46.0)
Hemoglobin: 13.2 g/dL (ref 12.0–15.0)
MCH: 28.1 pg (ref 26.0–34.0)
MCHC: 31.4 g/dL (ref 30.0–36.0)
MCV: 89.8 fL (ref 80.0–100.0)
Platelets: 274 K/uL (ref 150–400)
RBC: 4.69 MIL/uL (ref 3.87–5.11)
RDW: 12.5 % (ref 11.5–15.5)
WBC: 5.2 K/uL (ref 4.0–10.5)
nRBC: 0 % (ref 0.0–0.2)

## 2023-10-23 NOTE — Progress Notes (Signed)
 For Anesthesia: PCP - Loreli Elsie JONETTA Mickey., MD  Cardiologist -   Bowel Prep reminder:  Chest x-ray - CT cardiac: 02/01/18 EKG -  Stress Test -  ECHO -  Cardiac Cath -  Pacemaker/ICD device last checked: Pacemaker orders received: Device Rep notified:  Spinal Cord Stimulator:N/A  Sleep Study - N/A CPAP -   Fasting Blood Sugar - N/A Checks Blood Sugar _____ times a day Date and result of last Hgb A1c-  Last dose of GLP1 agonist- N/A GLP1 instructions: Hold 7 days prior to schedule (Hold 24 hours-daily)   Last dose of SGLT-2 inhibitors- N/A SGLT-2 instructions: Hold 72 hours prior to surgery  Blood Thinner Instructions:N/A Last Dose: Time last taken:  Aspirin Instructions:N/A Last Dose: Time last taken:  Activity level: Can go up a flight of stairs and activities of daily living without stopping and without chest pain and/or shortness of breath   Able to exercise without chest pain and/or shortness of breath     Anesthesia review: Hx: Emphysema.  Patient denies shortness of breath, fever, cough and chest pain at PAT appointment   Patient verbalized understanding of instructions that were reviewed over the telephone.

## 2023-10-29 ENCOUNTER — Encounter: Admitting: Physical Therapy

## 2023-11-06 ENCOUNTER — Encounter (HOSPITAL_COMMUNITY)
Admission: RE | Disposition: A | Discharge status: Home / Self Care | Payer: Self-pay | Source: Home / Self Care | Attending: Orthopedic Surgery

## 2023-11-06 ENCOUNTER — Other Ambulatory Visit: Payer: Self-pay

## 2023-11-06 ENCOUNTER — Ambulatory Visit (HOSPITAL_COMMUNITY): Payer: Self-pay | Admitting: Anesthesiology

## 2023-11-06 ENCOUNTER — Encounter (HOSPITAL_COMMUNITY): Payer: Self-pay | Admitting: Orthopedic Surgery

## 2023-11-06 ENCOUNTER — Ambulatory Visit (HOSPITAL_COMMUNITY)
Admission: RE | Admit: 2023-11-06 | Discharge: 2023-11-06 | Disposition: A | Discharge status: Home / Self Care | Attending: Orthopedic Surgery | Admitting: Orthopedic Surgery

## 2023-11-06 ENCOUNTER — Ambulatory Visit (HOSPITAL_COMMUNITY): Payer: Self-pay | Admitting: Physician Assistant

## 2023-11-06 DIAGNOSIS — S76012A Strain of muscle, fascia and tendon of left hip, initial encounter: Secondary | ICD-10-CM | POA: Insufficient documentation

## 2023-11-06 DIAGNOSIS — Z87891 Personal history of nicotine dependence: Secondary | ICD-10-CM | POA: Diagnosis not present

## 2023-11-06 DIAGNOSIS — M7062 Trochanteric bursitis, left hip: Secondary | ICD-10-CM | POA: Insufficient documentation

## 2023-11-06 DIAGNOSIS — J449 Chronic obstructive pulmonary disease, unspecified: Secondary | ICD-10-CM | POA: Diagnosis not present

## 2023-11-06 DIAGNOSIS — X58XXXA Exposure to other specified factors, initial encounter: Secondary | ICD-10-CM | POA: Diagnosis not present

## 2023-11-06 DIAGNOSIS — E039 Hypothyroidism, unspecified: Secondary | ICD-10-CM | POA: Diagnosis not present

## 2023-11-06 DIAGNOSIS — M1990 Unspecified osteoarthritis, unspecified site: Secondary | ICD-10-CM | POA: Insufficient documentation

## 2023-11-06 HISTORY — PX: GLUTEUS MINIMUS REPAIR: SHX5843

## 2023-11-06 SURGERY — REPAIR, TENDON, GLUTEUS MINIMUS
Anesthesia: General | Laterality: Left

## 2023-11-06 MED ORDER — PHENYLEPHRINE 80 MCG/ML (10ML) SYRINGE FOR IV PUSH (FOR BLOOD PRESSURE SUPPORT)
PREFILLED_SYRINGE | INTRAVENOUS | Status: DC | PRN
  Administered 2023-11-06: 120 ug via INTRAVENOUS
  Administered 2023-11-06: 80 ug via INTRAVENOUS
  Administered 2023-11-06: 120 ug via INTRAVENOUS

## 2023-11-06 MED ORDER — ORAL CARE MOUTH RINSE: 15.0000 mL | Freq: Once | OROMUCOSAL | Status: DC

## 2023-11-06 MED ORDER — ALBUMIN HUMAN 5 % IV SOLN
12.5000 g | Freq: Once | INTRAVENOUS | Status: AC
  Administered 2023-11-06: 12.5 g via INTRAVENOUS

## 2023-11-06 MED ORDER — HYDROMORPHONE HCL 1 MG/ML IJ SOLN
INTRAMUSCULAR | Status: AC
  Filled 2023-11-06: qty 1

## 2023-11-06 MED ORDER — ONDANSETRON 4 MG PO TBDP: 4.0000 mg | ORAL_TABLET | Freq: Three times a day (TID) | ORAL | 0 refills | Status: DC | PRN

## 2023-11-06 MED ORDER — HYDROCODONE-ACETAMINOPHEN 7.5-325 MG PO TABS: 1.0000 | ORAL_TABLET | Freq: Four times a day (QID) | ORAL | 0 refills | Status: DC | PRN

## 2023-11-06 MED ORDER — EPHEDRINE SULFATE (PRESSORS) 25 MG/5ML IV SOSY
PREFILLED_SYRINGE | INTRAVENOUS | Status: DC | PRN
  Administered 2023-11-06: 5 mg via INTRAVENOUS
  Administered 2023-11-06 (×2): 10 mg via INTRAVENOUS

## 2023-11-06 MED ORDER — ONDANSETRON HCL 4 MG/2ML IJ SOLN
INTRAMUSCULAR | Status: DC | PRN
  Administered 2023-11-06: 4 mg via INTRAVENOUS

## 2023-11-06 MED ORDER — SUGAMMADEX SODIUM 200 MG/2ML IV SOLN
INTRAVENOUS | Status: DC | PRN
Start: 2023-11-06 — End: 2023-11-06
  Administered 2023-11-06: 150 mg via INTRAVENOUS

## 2023-11-06 MED ORDER — FENTANYL CITRATE (PF) 250 MCG/5ML IJ SOLN
INTRAMUSCULAR | Status: AC
  Filled 2023-11-06: qty 5

## 2023-11-06 MED ORDER — VANCOMYCIN HCL 1000 MG IV SOLR
INTRAVENOUS | Status: DC | PRN
  Administered 2023-11-06: 1000 mg

## 2023-11-06 MED ORDER — FENTANYL CITRATE (PF) 50 MCG/ML IJ SOSY
25.0000 ug | PREFILLED_SYRINGE | INTRAMUSCULAR | Status: DC | PRN
  Administered 2023-11-06 (×2): 50 ug via INTRAVENOUS

## 2023-11-06 MED ORDER — TRAMADOL HCL 50 MG PO TABS: 50.0000 mg | ORAL_TABLET | ORAL | 0 refills | Status: AC

## 2023-11-06 MED ORDER — BUPIVACAINE-EPINEPHRINE 0.25% -1:200000 IJ SOLN
INTRAMUSCULAR | Status: DC | PRN
  Administered 2023-11-06: 20 mL

## 2023-11-06 MED ORDER — ACETAMINOPHEN 10 MG/ML IV SOLN
1000.0000 mg | Freq: Once | INTRAVENOUS | Status: DC | PRN
  Administered 2023-11-06: 1000 mg via INTRAVENOUS

## 2023-11-06 MED ORDER — HYDROMORPHONE HCL 1 MG/ML IJ SOLN
0.2500 mg | INTRAMUSCULAR | Status: DC | PRN
  Administered 2023-11-06 (×4): 0.5 mg via INTRAVENOUS

## 2023-11-06 MED ORDER — CHLORHEXIDINE GLUCONATE 0.12 % MT SOLN: 15.0000 mL | Freq: Once | OROMUCOSAL | Status: DC

## 2023-11-06 MED ORDER — PROPOFOL 10 MG/ML IV BOLUS
INTRAVENOUS | Status: DC | PRN
  Administered 2023-11-06: 130 mg via INTRAVENOUS

## 2023-11-06 MED ORDER — ROCURONIUM BROMIDE 10 MG/ML (PF) SYRINGE
PREFILLED_SYRINGE | INTRAVENOUS | Status: DC | PRN
  Administered 2023-11-06: 50 mg via INTRAVENOUS

## 2023-11-06 MED ORDER — MIDAZOLAM HCL (PF) 2 MG/2ML IJ SOLN
INTRAMUSCULAR | Status: DC | PRN
  Administered 2023-11-06: 2 mg via INTRAVENOUS

## 2023-11-06 MED ORDER — ALBUMIN HUMAN 5 % IV SOLN
INTRAVENOUS | Status: AC
  Filled 2023-11-06: qty 250

## 2023-11-06 MED ORDER — BUPIVACAINE-EPINEPHRINE (PF) 0.25% -1:200000 IJ SOLN
INTRAMUSCULAR | Status: AC
  Filled 2023-11-06: qty 30

## 2023-11-06 MED ORDER — TRANEXAMIC ACID-NACL 1000-0.7 MG/100ML-% IV SOLN
1000.0000 mg | INTRAVENOUS | Status: AC
  Administered 2023-11-06: 1000 mg via INTRAVENOUS
  Filled 2023-11-06: qty 100

## 2023-11-06 MED ORDER — VANCOMYCIN HCL 1000 MG IV SOLR
INTRAVENOUS | Status: AC
Start: 2023-11-06 — End: 2023-11-06
  Filled 2023-11-06: qty 20

## 2023-11-06 MED ORDER — ONDANSETRON HCL 4 MG/2ML IJ SOLN
INTRAMUSCULAR | Status: AC
  Filled 2023-11-06: qty 2

## 2023-11-06 MED ORDER — 0.9 % SODIUM CHLORIDE (POUR BTL) OPTIME
TOPICAL | Status: DC | PRN
  Administered 2023-11-06: 1000 mL

## 2023-11-06 MED ORDER — PROPOFOL 10 MG/ML IV BOLUS
INTRAVENOUS | Status: AC
  Filled 2023-11-06: qty 20

## 2023-11-06 MED ORDER — FENTANYL CITRATE (PF) 50 MCG/ML IJ SOSY
PREFILLED_SYRINGE | INTRAMUSCULAR | Status: AC
  Filled 2023-11-06: qty 2

## 2023-11-06 MED ORDER — DEXAMETHASONE SOD PHOSPHATE PF 10 MG/ML IJ SOLN
INTRAMUSCULAR | Status: DC | PRN
  Administered 2023-11-06: 8 mg via INTRAVENOUS

## 2023-11-06 MED ORDER — ROCURONIUM BROMIDE 10 MG/ML (PF) SYRINGE
PREFILLED_SYRINGE | INTRAVENOUS | Status: AC
  Filled 2023-11-06: qty 10

## 2023-11-06 MED ORDER — MIDAZOLAM HCL 2 MG/2ML IJ SOLN
INTRAMUSCULAR | Status: AC
Start: 2023-11-06 — End: 2023-11-06
  Filled 2023-11-06: qty 2

## 2023-11-06 MED ORDER — LIDOCAINE HCL (PF) 2 % IJ SOLN
INTRAMUSCULAR | Status: AC
Start: 2023-11-06 — End: 2023-11-06
  Filled 2023-11-06: qty 5

## 2023-11-06 MED ORDER — FENTANYL CITRATE (PF) 250 MCG/5ML IJ SOLN
INTRAMUSCULAR | Status: DC | PRN
  Administered 2023-11-06 (×2): 50 ug via INTRAVENOUS
  Administered 2023-11-06: 25 ug via INTRAVENOUS
  Administered 2023-11-06 (×2): 50 ug via INTRAVENOUS
  Administered 2023-11-06: 25 ug via INTRAVENOUS

## 2023-11-06 MED ORDER — LIDOCAINE HCL (PF) 2 % IJ SOLN
INTRAMUSCULAR | Status: DC | PRN
  Administered 2023-11-06: 60 mg via INTRADERMAL

## 2023-11-06 MED ORDER — CEFAZOLIN SODIUM-DEXTROSE 2-4 GM/100ML-% IV SOLN
2.0000 g | INTRAVENOUS | Status: AC
  Administered 2023-11-06: 2 g via INTRAVENOUS
  Filled 2023-11-06: qty 100

## 2023-11-06 MED ORDER — ONDANSETRON 4 MG PO TBDP: 4.0000 mg | ORAL_TABLET | Freq: Three times a day (TID) | ORAL | 0 refills | Status: AC | PRN

## 2023-11-06 MED ORDER — DROPERIDOL 2.5 MG/ML IJ SOLN
0.6250 mg | Freq: Once | INTRAMUSCULAR | Status: AC | PRN
  Administered 2023-11-06: 0.625 mg via INTRAVENOUS

## 2023-11-06 MED ORDER — DROPERIDOL 2.5 MG/ML IJ SOLN
INTRAMUSCULAR | Status: AC
  Filled 2023-11-06: qty 2

## 2023-11-06 MED ORDER — LACTATED RINGERS IV SOLN: INTRAVENOUS | Status: DC

## 2023-11-06 MED ORDER — ACETAMINOPHEN 10 MG/ML IV SOLN
INTRAVENOUS | Status: AC
  Filled 2023-11-06: qty 100

## 2023-11-06 SURGICAL SUPPLY — 35 items
ANCHOR SUT FBRTK 2.6 SP #5 (Anchor) ×2 IMPLANT
ANCHOR SWIVELOCK SP KL 4.75 (Anchor) ×1 IMPLANT
BAG COUNTER SPONGE SURGICOUNT (BAG) IMPLANT
BAG ZIPLOCK 12X15 (MISCELLANEOUS) ×2 IMPLANT
CONNECTOR 5 IN 1 STRAIGHT STRL (MISCELLANEOUS) ×2 IMPLANT
COVER SURGICAL LIGHT HANDLE (MISCELLANEOUS) ×2 IMPLANT
DRAPE HIP W/POCKET STRL (MISCELLANEOUS) ×2 IMPLANT
DRAPE SURG 17X11 SM STRL (DRAPES) ×2 IMPLANT
DRAPE U-SHAPE 47X51 STRL (DRAPES) ×2 IMPLANT
DRSG AQUACEL AG ADV 3.5X 6 (GAUZE/BANDAGES/DRESSINGS) ×2 IMPLANT
DURAPREP 26ML APPLICATOR (WOUND CARE) ×2 IMPLANT
ELECT PENCIL ROCKER SW 15FT (MISCELLANEOUS) ×2 IMPLANT
ELECT REM PT RETURN 15FT ADLT (MISCELLANEOUS) ×2 IMPLANT
GLOVE BIO SURGEON STRL SZ7.5 (GLOVE) ×4 IMPLANT
GLOVE BIOGEL PI IND STRL 8 (GLOVE) ×4 IMPLANT
GOWN STRL REUS W/ TWL XL LVL3 (GOWN DISPOSABLE) ×4 IMPLANT
KIT ANCHOR FBRTK 2.6 STR (KITS) ×1 IMPLANT
KIT BASIN OR (CUSTOM PROCEDURE TRAY) ×2 IMPLANT
KIT TURNOVER KIT A (KITS) ×2 IMPLANT
MANIFOLD NEPTUNE II (INSTRUMENTS) ×2 IMPLANT
NDL SAFETY ECLIPSE 18X1.5 (NEEDLE) ×2 IMPLANT
NS IRRIG 1000ML POUR BTL (IV SOLUTION) ×2 IMPLANT
PACK TOTAL JOINT (CUSTOM PROCEDURE TRAY) ×2 IMPLANT
STRIP CLOSURE SKIN 1/2X4 (GAUZE/BANDAGES/DRESSINGS) ×2 IMPLANT
SUT ETHIBOND NAB CT1 #1 30IN (SUTURE) IMPLANT
SUT MNCRL AB 3-0 PS2 18 (SUTURE) ×2 IMPLANT
SUT MON AB 2-0 CT1 36 (SUTURE) ×2 IMPLANT
SUT STRATAFIX PDS+ 0 24IN (SUTURE) ×1 IMPLANT
SUT VIC AB 0 CT1 36 (SUTURE) ×2 IMPLANT
SUT VIC AB 1 CT1 27XBRD ANTBC (SUTURE) ×2 IMPLANT
SUT VICRYL+ 3-0 36IN CT-1 (SUTURE) ×2 IMPLANT
SUTURE FIBERWR #2 38 T-5 BLUE (SUTURE) IMPLANT
SYR 30ML LL (SYRINGE) ×2 IMPLANT
TOWEL OR 17X26 10 PK STRL BLUE (TOWEL DISPOSABLE) ×4 IMPLANT
TUBING CONNECTING 10 (TUBING) ×2 IMPLANT

## 2023-11-06 NOTE — Anesthesia Preprocedure Evaluation (Addendum)
 Anesthesia Evaluation  Patient identified by MRN, date of birth, ID band Patient awake    Reviewed: Allergy & Precautions, NPO status , Patient's Chart, lab work & pertinent test results  Airway Mallampati: II  TM Distance: >3 FB Neck ROM: Full    Dental no notable dental hx.    Pulmonary COPD, former smoker   Pulmonary exam normal        Cardiovascular negative cardio ROS  Rhythm:Regular Rate:Normal     Neuro/Psych   Anxiety Depression    negative neurological ROS     GI/Hepatic Neg liver ROS, PUD,,,  Endo/Other  Hypothyroidism    Renal/GU negative Renal ROS  negative genitourinary   Musculoskeletal  (+) Arthritis , Osteoarthritis,    Abdominal Normal abdominal exam  (+)   Peds  Hematology Lab Results      Component                Value               Date                      WBC                      5.2                 10/23/2023                HGB                      13.2                10/23/2023                HCT                      42.1                10/23/2023                MCV                      89.8                10/23/2023                PLT                      274                 10/23/2023              Anesthesia Other Findings   Reproductive/Obstetrics                              Anesthesia Physical Anesthesia Plan  ASA: 2  Anesthesia Plan: General   Post-op Pain Management:    Induction: Intravenous  PONV Risk Score and Plan: 3 and Ondansetron , Dexamethasone and Treatment may vary due to age or medical condition  Airway Management Planned: Mask and Oral ETT  Additional Equipment: None  Intra-op Plan:   Post-operative Plan: Extubation in OR  Informed Consent: I have reviewed the patients History and Physical, chart, labs and discussed the procedure including the risks, benefits and alternatives for the proposed anesthesia with the patient or  authorized representative  who has indicated his/her understanding and acceptance.     Dental advisory given  Plan Discussed with: CRNA  Anesthesia Plan Comments:         Anesthesia Quick Evaluation

## 2023-11-06 NOTE — Anesthesia Procedure Notes (Signed)
 Procedure Name: Intubation Date/Time: 11/06/2023 1:47 PM  Performed by: Augusta Daved SAILOR, CRNAPre-anesthesia Checklist: Patient identified, Emergency Drugs available, Suction available and Patient being monitored Patient Re-evaluated:Patient Re-evaluated prior to induction Oxygen Delivery Method: Circle System Utilized Preoxygenation: Pre-oxygenation with 100% oxygen Induction Type: IV induction Ventilation: Mask ventilation without difficulty Laryngoscope Size: Miller and 2 Grade View: Grade I Tube type: Oral Tube size: 7.0 mm Number of attempts: 1 Airway Equipment and Method: Stylet and Oral airway Placement Confirmation: ETT inserted through vocal cords under direct vision, positive ETCO2 and breath sounds checked- equal and bilateral Secured at: 21 (at the lip) cm Tube secured with: Tape Dental Injury: Teeth and Oropharynx as per pre-operative assessment

## 2023-11-06 NOTE — Progress Notes (Signed)
 Orthopedic Tech Progress Note Patient Details:  Carly Cook 01-05-1961 [id number]  Crutches delivered and adjusted for pt in PACU. Pt has used crutches before and expressed understanding of her WB restrictions for the LLE. I did not have her ambulate with the crutches in PACU as she was c/o mild lightheadedness.  Ortho Devices Type of Ortho Device: Crutches Ortho Device/Splint Location: adjusted for pt, at bedside in PACU 10 Ortho Device/Splint Interventions: Ordered, Adjustment   Post Interventions Patient Tolerated: Well Instructions Provided: Poper ambulation with device, Care of device, Adjustment of device  Javarius Tsosie Ronal Brasil 11/06/2023, 5:44 PM

## 2023-11-06 NOTE — Brief Op Note (Signed)
 11/06/2023  2:35 PM  PATIENT:  Carly Cook  63 y.o. female  PRE-OPERATIVE DIAGNOSIS:  Left hip gluteus tear  POST-OPERATIVE DIAGNOSIS:  Left hip gluteus tear  PROCEDURE:  Procedure(s): OPEN GLUTEUS TENDON REPAIR WITH OPEN BURSECTOMY (Left)  SURGEON:  Surgeons and Role:    * Sharl Selinda Dover, MD - Primary  PHYSICIAN ASSISTANT: Dayle Moores, PA-C   ANESTHESIA:   local and general  EBL:  20 cc  BLOOD ADMINISTERED:none  DRAINS: none   LOCAL MEDICATIONS USED:  MARCAINE     SPECIMEN:  No Specimen  DISPOSITION OF SPECIMEN:  N/A  COUNTS:  YES  TOURNIQUET:  * No tourniquets in log *  DICTATION: .Note written in EPIC  PLAN OF CARE: Discharge to home after PACU  PATIENT DISPOSITION:  PACU - hemodynamically stable.   Delay start of Pharmacological VTE agent (>24hrs) due to surgical blood loss or risk of bleeding: not applicable

## 2023-11-06 NOTE — H&P (Signed)
 ORTHOPAEDIC H and P  REQUESTING PHYSICIAN: Sharl Selinda Dover, MD  PCP:  Loreli Elsie JONETTA Mickey., MD  Chief Complaint: Left gluteus tendon tear  HPI: Carly Cook is a 63 y.o. female who complains of left hip pain and weakness.  Here today for left gluteal tendon repair.  No new complaints.  Past Medical History:  Diagnosis Date   Anxiety    Breast cancer (HCC) [phone]   sx only   Depression    DJD (degenerative joint disease), lumbar    Ductal carcinoma in situ (DCIS) of right breast    Emphysema of lung (HCC)    Per patient, she is early stage emphysema due to a 33 year smoking history   Hypercholesterolemia    Hypothyroidism    Ulcerative colitis (HCC)    Past Surgical History:  Procedure Laterality Date   COLONOSCOPY  last 12/2014   FOOT FOREIGN BODY REMOVAL Right    FOOT FRACTURE SURGERY Left    SIMPLE MASTECTOMY Right    5 sentinel lymph nodes removed   SIMPLE MASTECTOMY Left    1 sentinel lymph node removed   Social History   Socioeconomic History   Marital status: Single    Spouse name: Not on file   Number of children: Not on file   Years of education: Not on file   Highest education level: Not on file  Occupational History   Occupation: Secretary/administrator  Tobacco Use   Smoking status: Former    Current packs/day: 0.00    Types: Cigarettes    Quit date: 03/02/2006    Years since quitting: 17.6   Smokeless tobacco: Never  Vaping Use   Vaping status: Never Used  Substance and Sexual Activity   Alcohol use: Not Currently    Comment: occ.   Drug use: Never   Sexual activity: Not on file  Other Topics Concern   Not on file  Social History Narrative   Not on file   Social Drivers of Health   Financial Resource Strain: Not on file  Food Insecurity: Not on file  Transportation Needs: Not on file  Physical Activity: Not on file  Stress: Not on file  Social Connections: Not on file   Family History  Problem Relation Age of Onset    Diabetes Father    Osteoporosis Mother    High blood pressure Mother    High blood pressure Sister    Colon cancer Neg Hx    Colon polyps Neg Hx    Esophageal cancer Neg Hx    Prostate cancer Neg Hx    Rectal cancer Neg Hx    Pancreatic cancer Neg Hx    Allergies  Allergen Reactions   Oxycodone-Acetaminophen Shortness Of Breath   Norco [Hydrocodone-Acetaminophen] Nausea Only    Hallucinations, nausea   Remicade [Infliximab]     MYALGIAS...    Erythromycin Rash   Prior to Admission medications   Medication Sig Start Date End Date Taking? Authorizing Provider  ALPRAZolam (XANAX) 0.5 MG tablet Take 0.5 mg by mouth at bedtime as needed for anxiety or sleep. 09/28/23  Yes [provider]  ASHWAGANDHA PO Take 800 mg by mouth daily.   Yes [provider]  Biotin 5000 MCG TABS Take 5,000 mcg by mouth daily.   Yes [provider]  BORON PO Take 3 mg by mouth daily.   Yes [provider]  CALCIUM CITRATE PO Take 800 mg by mouth daily.   Yes [provider]  clobetasol (TEMOVATE) 0.05 % external solution Apply 1 Application topically daily as needed (irritated scalp).   Yes [provider]  Cyanocobalamin (B-12 PO) Take 1,500 mcg by mouth daily.   Yes [provider]  estradiol (ESTRACE) 0.1 MG/GM vaginal cream Place 1 Applicatorful vaginally daily as needed (irritation). 02/22/21  Yes [provider]  levothyroxine (SYNTHROID) 50 MCG tablet Take 50 mcg by mouth every morning. 04/13/20  Yes [provider]  Magnesium-Zinc (MAGNESIUM-CHELATED ZINC PO) Take by mouth.   Yes [provider]  Omega-3 Fatty Acids (OMEGA-3 PO) Take 1 capsule by mouth daily.   Yes [provider]  rosuvastatin (CRESTOR) 10 MG tablet Take 10 mg by mouth daily. 02/05/18  Yes [provider]  Turmeric 500 MG CAPS Take 500 mg by mouth daily.   Yes [provider]  vedolizumab  (ENTYVIO ) 300 MG injection Infuse  every 8 weeks per protocol 11/13/20  Yes Nandigam, Kavitha V, MD  VITAMIN D -VITAMIN K PO Take 1 tablet by mouth daily.   Yes [provider]  zolpidem (AMBIEN) 5 MG tablet Take 5 mg by mouth at bedtime as needed for sleep. 11/07/22  Yes [provider]  mesalamine  (LIALDA ) 1.2 g EC tablet Take 2 tablets (2.4 g total) by mouth daily with breakfast. Patient not taking: Reported on 10/22/2023 03/20/23   Nandigam, Kavitha V, MD   No results found.  Positive ROS: All other systems have been reviewed and were otherwise negative with the exception of those mentioned in the HPI and as above.  Physical Exam: General: Alert, no acute distress Cardiovascular: No pedal edema Respiratory: No cyanosis, no use of accessory musculature GI: No organomegaly, abdomen is soft and non-tender Skin: No lesions in the area of chief complaint Neurologic: Sensation intact distally Psychiatric: Patient is competent for consent with normal mood and affect Lymphatic: No axillary or cervical lymphadenopathy  MUSCULOSKELETAL: LLE-- wwp, NVI.  Assessment: Left gluteal tendon tear  Plan: - plan to proceed today with open repair.  We reviewed risks and benefits in detail, including but not limited to bleeding, infection, persistent pain, and dysfunction with need for further surgery as well as the risk of DVT and anesthesia.  She has provided informed consent.  - plan for dc home post op.    Selinda Belvie Gosling, MD Cell [phone]    11/06/2023 12:10 PM

## 2023-11-06 NOTE — Transfer of Care (Signed)
 Immediate Anesthesia Transfer of Care Note  Patient: Carly Cook  Procedure(s) Performed: OPEN GLUTEUS TENDON REPAIR WITH OPEN BURSECTOMY (Left)  Patient Location: PACU  Anesthesia Type:General  Level of Consciousness: awake, alert , oriented, and patient cooperative  Airway & Oxygen Therapy: Patient Spontanous Breathing and Patient connected to face mask oxygen  Post-op Assessment: Report given to RN and Post -op Vital signs reviewed and stable  Post vital signs: Reviewed and stable  Last Vitals:  Vitals Value Taken Time  BP 120/97 11/06/23 15:00  Temp    Pulse 85 11/06/23 15:00  Resp 16 11/06/23 15:00  SpO2 100% 11/06/23 15:00    Last Pain:  Vitals:   11/06/23 1130  TempSrc:   PainSc: 0-No pain         Complications: No notable events documented.

## 2023-11-06 NOTE — Op Note (Signed)
 INDICATIONS: Carly Cook is a 63 y.o.-year-old female with a left hip trochanteric bursitis as well as gluteus minimus tear;  The patient did consent to the procedure after discussion of the risks and benefits.   PREOPERATIVE DIAGNOSIS:  1.  Left hip trochanteric bursitis 2.  Left hip gluteus minimus tear   POSTOPERATIVE DIAGNOSIS: Same.   PROCEDURE:  1.  Left hip open trochanteric bursectomy 2.  Left hip open gluteus minimus and medius repair   SURGEON: Selinda SHAUNNA Gosling, M.D.   ASSIST: Dayle Moores, PA-C   Assistant attestation:   PA McClung scrubbed and present for the entire procedure..   ANESTHESIA:  general, local with quarter percent Marcaine with epinephrine    IV FLUIDS AND URINE: See anesthesia.   ESTIMATED BLOOD LOSS: 20 mL.   IMPLANTS: Arthrex 2.6 mm #5 knotless fiber tack x 2 Arthrex 4.75 mm swivel lock anchor x 1   DRAINS: None   COMPLICATIONS: None.   DESCRIPTION OF PROCEDURE: The patient was brought to the operating room and placed supine on the operating table.  The patient had been signed prior to the procedure and this was documented. The patient had the anesthesia placed by the anesthesiologist.  A time-out was performed to confirm that this was the correct patient, site, side and location. The patient did receive antibiotics prior to the incision and was re-dosed during the procedure as needed at indicated intervals.  A tourniquet was not placed.  We then placed position in the left lateral decubitus position with the right hip elevated and expose.  The patient had the operative extremity prepped and draped in the standard surgical fashion.        Began the procedure by establishing a direct lateral approach to the hip.  We dissected down through skin and subcutaneous tissue to the level of the iliotibial band.  The iliotibial band was then incised sharply in line with fibers.  We then bluntly separated the gluteus max.  We then placed the leg in an abducted  position to take tension off the IT band.  We are able to place deep retractors at this time.  We then performed our open bursectomy.  She had moderate bursitis that was easily identified and hyperemic.  This was excised sharply with knife and Bovie.  We then identified some hyperemia as well in the gluteus medius tendon.  There was some softening of the gluteus medius consistent with gluteus minimus tearing deep to the medius.  We then elected to do a compression style in situ repair with 2 separate 2.6 mm Arthrex fiber tack anchors.  1 was placed anterior to the gluteus medius tendon and 1 was placed posterior.  We then used a #5 sutures from these to perform a double pulley technique to create excellent compression across the tendon site of repair.   Lastly, we used the residual limbs of the #5 sutures and loaded these into the a 4.75 mm swivel lock anchor to work as a backup post and also create further compression across the more distal aspect of the tendon.  This had excellent fixation as well.  Suture lines were cut flush.   Next, we irrigated the wound copiously with normal saline.  We placed #1 running locking suture in the IT band.  We then irrigated once again and placed a gram of vancomycin powder into the wound.  Subcutaneous fat was closed with 0 Vicryl.  Deep dermal layer with 2-0 Monocryl and running subcuticular 3-0 Monocryl with Steri-Strips for  skin.  The wounds were infiltrated with quarter percent Marcaine with epinephrine .   Aquacel bandage was applied.  All counts were correct x 2.  She was awoken from general anesthesia and transported to PACU in stable condition.   POSTOPERATIVE PLAN:  Carly Cook will be touchdown weightbearing up to 50% on the left  lower extremity with crutches for the first 6 weeks.  She will avoid active abduction.  She can range the hip otherwise with therapy as tolerated.  She will be on DVT prophylaxis with 81 mg aspirin x 6 weeks.  See her back in the  office in 2 weeks for wound check.

## 2023-11-06 NOTE — Discharge Instructions (Addendum)
 Orthopedic surgery discharge instructions:  - Maintain postoperative bandage until your follow-up appointment.  You may shower with this in place on postoperative day #3.  Please do not submerge underwater.  - You are okay to put up to 50% weight on the operative extremity with crutches.  Will do this for 6 weeks from surgery.  - Avoid active abduction (moving your operative leg away from the midline).  - Apply ice liberally to the operative site for 20 to 30 minutes out of each hour that you are able.  - For mild to moderate pain use Tylenol and Advil around-the-clock.  For any breakthrough pain use the oxycodone as necessary.  - For the prevention of DVT take an 81 mg aspirin twice per day x 6 weeks.

## 2023-11-09 ENCOUNTER — Encounter (HOSPITAL_COMMUNITY): Payer: Self-pay | Admitting: Orthopedic Surgery

## 2023-11-09 NOTE — Anesthesia Postprocedure Evaluation (Signed)
 Anesthesia Post Note  Patient: Carly Cook  Procedure(s) Performed: OPEN GLUTEUS TENDON REPAIR WITH OPEN BURSECTOMY (Left)     Patient location during evaluation: PACU Anesthesia Type: General Level of consciousness: awake and alert Pain management: pain level controlled Vital Signs Assessment: post-procedure vital signs reviewed and stable Respiratory status: spontaneous breathing, nonlabored ventilation, respiratory function stable and patient connected to nasal cannula oxygen Cardiovascular status: blood pressure returned to baseline and stable Postop Assessment: no apparent nausea or vomiting Anesthetic complications: no   No notable events documented.  Last Vitals:  Vitals:   11/06/23 1920 11/06/23 2016  BP: (!) 101/53 (!) 104/54  Pulse: 64 87  Resp: 16 14  Temp: (!) 36.3 C   SpO2: 98% 93%    Last Pain:  Vitals:   11/06/23 2016  TempSrc:   PainSc: 3                  Roisin Mones P Tejay Hubert

## 2024-01-19 ENCOUNTER — Emergency Department (HOSPITAL_COMMUNITY)
Admission: EM | Admit: 2024-01-19 | Discharge: 2024-01-19 | Disposition: A | Discharge status: Home / Self Care | Attending: Emergency Medicine | Admitting: Emergency Medicine

## 2024-01-19 ENCOUNTER — Encounter (HOSPITAL_COMMUNITY): Payer: Self-pay

## 2024-01-19 ENCOUNTER — Emergency Department (HOSPITAL_COMMUNITY)

## 2024-01-19 ENCOUNTER — Other Ambulatory Visit: Payer: Self-pay

## 2024-01-19 DIAGNOSIS — W11XXXA Fall on and from ladder, initial encounter: Secondary | ICD-10-CM | POA: Diagnosis not present

## 2024-01-19 DIAGNOSIS — Z23 Encounter for immunization: Secondary | ICD-10-CM | POA: Insufficient documentation

## 2024-01-19 DIAGNOSIS — Z853 Personal history of malignant neoplasm of breast: Secondary | ICD-10-CM | POA: Insufficient documentation

## 2024-01-19 DIAGNOSIS — S0101XA Laceration without foreign body of scalp, initial encounter: Secondary | ICD-10-CM | POA: Insufficient documentation

## 2024-01-19 DIAGNOSIS — Z79899 Other long term (current) drug therapy: Secondary | ICD-10-CM | POA: Insufficient documentation

## 2024-01-19 DIAGNOSIS — S0990XA Unspecified injury of head, initial encounter: Secondary | ICD-10-CM | POA: Diagnosis present

## 2024-01-19 DIAGNOSIS — E039 Hypothyroidism, unspecified: Secondary | ICD-10-CM | POA: Insufficient documentation

## 2024-01-19 MED ORDER — TETANUS-DIPHTH-ACELL PERTUSSIS 5-2-15.5 LF-MCG/0.5 IM SUSP
0.5000 mL | Freq: Once | INTRAMUSCULAR | Status: AC
  Administered 2024-01-19: 0.5 mL via INTRAMUSCULAR
  Filled 2024-01-19: qty 0.5

## 2024-01-19 MED ORDER — CEPHALEXIN 500 MG PO CAPS: 500.0000 mg | ORAL_CAPSULE | Freq: Four times a day (QID) | ORAL | 0 refills | Status: AC

## 2024-01-19 MED ORDER — LIDOCAINE-EPINEPHRINE (PF) 2 %-1:200000 IJ SOLN
10.0000 mL | Freq: Once | INTRAMUSCULAR | Status: DC
  Filled 2024-01-19: qty 20

## 2024-01-19 NOTE — ED Provider Notes (Signed)
 " [place] EMERGENCY DEPARTMENT AT [facility] Provider Note   CSN: [id number] Arrival date & time: 01/19/24  1304     Patient presents with: Head Injury   Jeniyah CHRISTELLA Claunch is a 64 y.o. female.   The history is provided by the patient. No language interpreter was used.  Head Injury Head/neck injury location: crown. Time since incident:  1 hour Mechanism of injury: direct blow   Pain details:    Quality:  Aching   Timing:  Constant   Progression:  Improving Chronicity:  New Relieved by:  Nothing Worsened by:  Nothing Ineffective treatments:  None tried Associated symptoms: headache   Associated symptoms: no blurred vision, no disorientation, no double vision, no focal weakness, no loss of consciousness, no memory loss, no nausea, no neck pain, no numbness, no seizures and no vomiting        Prior to Admission medications  Medication Sig Start Date End Date Taking? Authorizing Provider  ALPRAZolam (XANAX) 0.5 MG tablet Take 0.5 mg by mouth at bedtime as needed for anxiety or sleep. 09/28/23   [provider]  ASHWAGANDHA PO Take 800 mg by mouth daily.    [provider]  Biotin 5000 MCG TABS Take 5,000 mcg by mouth daily.    [provider]  BORON PO Take 3 mg by mouth daily.    [provider]  CALCIUM CITRATE PO Take 800 mg by mouth daily.    [provider]  clobetasol (TEMOVATE) 0.05 % external solution Apply 1 Application topically daily as needed (irritated scalp).    [provider]  Cyanocobalamin (B-12 PO) Take 1,500 mcg by mouth daily.    [provider]  estradiol (ESTRACE) 0.1 MG/GM vaginal cream Place 1 Applicatorful vaginally daily as needed (irritation). 02/22/21   [provider]  levothyroxine (SYNTHROID) 50 MCG tablet Take 50 mcg by mouth every morning. 04/13/20   [provider]  Magnesium-Zinc (MAGNESIUM-CHELATED ZINC PO) Take by mouth.    [provider]   mesalamine  (LIALDA ) 1.2 g EC tablet Take 2 tablets (2.4 g total) by mouth daily with breakfast. Patient not taking: Reported on 10/22/2023 03/20/23   Nandigam, Kavitha V, MD  Omega-3 Fatty Acids (OMEGA-3 PO) Take 1 capsule by mouth daily.    [provider]  ondansetron  (ZOFRAN -ODT) 4 MG disintegrating tablet Take 1 tablet (4 mg total) by mouth every 8 (eight) hours as needed for nausea or vomiting. 11/06/23   Candance, Amber N, PA-C  rosuvastatin (CRESTOR) 10 MG tablet Take 10 mg by mouth daily. 02/05/18   [provider]  Turmeric 500 MG CAPS Take 500 mg by mouth daily.    [provider]  vedolizumab  (ENTYVIO ) 300 MG injection Infuse every 8 weeks per protocol 11/13/20   Nandigam, Kavitha V, MD  VITAMIN D -VITAMIN K PO Take 1 tablet by mouth daily.    [provider]  zolpidem (AMBIEN) 5 MG tablet Take 5 mg by mouth at bedtime as needed for sleep. 11/07/22   [provider]    Allergies: Oxycodone-acetaminophen , Norco [hydrocodone -acetaminophen ], Remicade [infliximab], and Erythromycin    Review of Systems  Constitutional:  Negative for chills and fatigue.  HENT:  Negative for congestion.   Eyes:  Negative for blurred vision and double vision.  Respiratory:  Negative for cough, chest tightness, shortness of breath and wheezing.   Cardiovascular:  Negative for chest pain and palpitations.  Gastrointestinal:  Negative for abdominal pain, constipation, diarrhea, nausea and vomiting.  Genitourinary:  Negative for dysuria.  Musculoskeletal:  Negative for back pain, neck pain and neck stiffness.  Skin:  Positive for wound.  Neurological:  Positive for headaches. Negative for dizziness, focal weakness, seizures, loss of consciousness, syncope, facial asymmetry, speech difficulty, weakness, light-headedness and numbness.  Psychiatric/Behavioral:  Negative for agitation and memory loss.     Updated Vital Signs BP 115/84 (BP Location: Left Arm)   Pulse 79    Temp 98.9 F (37.2 C) (Oral)   Resp 17   Ht 5' 4 (1.626 m)   Wt 61.2 kg   SpO2 100%   BMI 23.17 kg/m   Physical Exam Vitals and nursing note reviewed.  Constitutional:      General: She is not in acute distress.    Appearance: She is well-developed.  HENT:     Head:     Comments: 1 cm laceration to the crown of the head.  It is well-approximated and does not separate very far.  Some abrasion near it.  No active bleeding.  No crepitance or fluctuance.  No foreign body seen.    Nose: No congestion or rhinorrhea.     Mouth/Throat:     Mouth: Mucous membranes are moist.     Pharynx: No oropharyngeal exudate or posterior oropharyngeal erythema.  Eyes:     Extraocular Movements: Extraocular movements intact.     Conjunctiva/sclera: Conjunctivae normal.     Pupils: Pupils are equal, round, and reactive to light.  Cardiovascular:     Rate and Rhythm: Normal rate and regular rhythm.     Heart sounds: No murmur heard. Pulmonary:     Effort: Pulmonary effort is normal. No respiratory distress.     Breath sounds: Normal breath sounds. No wheezing, rhonchi or rales.  Chest:     Chest wall: No tenderness.  Abdominal:     General: Abdomen is flat.     Palpations: Abdomen is soft.     Tenderness: There is no abdominal tenderness.  Musculoskeletal:        General: Tenderness present. No swelling.     Cervical back: Neck supple. No tenderness.  Skin:    General: Skin is warm and dry.     Capillary Refill: Capillary refill takes less than 2 seconds.     Findings: No rash.  Neurological:     General: No focal deficit present.     Mental Status: She is alert.     Sensory: No sensory deficit.     Motor: No weakness.  Psychiatric:        Mood and Affect: Mood normal.     (all labs ordered are listed, but only abnormal results are displayed) Labs Reviewed - No data to display  EKG: None  Radiology: CT Head Wo Contrast Result Date: 01/19/2024 CLINICAL DATA:  Blunt trauma to head  with ladder. EXAM: CT HEAD WITHOUT CONTRAST TECHNIQUE: Contiguous axial images were obtained from the base of the skull through the vertex without intravenous contrast. RADIATION DOSE REDUCTION: This exam was performed according to the departmental dose-optimization program which includes automated exposure control, adjustment of the mA and/or kV according to patient size and/or use of iterative reconstruction technique. COMPARISON:  None Available. FINDINGS: Brain: Ventricles, cisterns and other CSF spaces are normal. No mass, mass effect, shift of midline structures or acute hemorrhage. 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 Vascular: No hyperdense vessel or unexpected calcification. Skull: Normal. Negative for fracture or focal lesion. Sinuses/Orbits: No acute finding. Other: None.  IMPRESSION: 1. No acute findings. 2. 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 Electronically Signed   By: Toribio Agreste M.D.   On: 01/19/2024 13:44     .Laceration Repair  Date/Time: 01/19/2024 4:09 PM  Performed by: Ginger Lonni PARAS, MD Authorized by: Ginger Lonni PARAS, MD   Consent:    Consent obtained:  Verbal   Consent given by:  Patient   Risks, benefits, and alternatives were discussed: yes     Risks discussed:  Pain and infection   Alternatives discussed:  No treatment Universal protocol:    Patient identity confirmed:  Verbally with patient Anesthesia:    Anesthesia method:  None Laceration details:    Location:  Scalp   Scalp location:  Crown   Length (cm):  1   Depth (mm):  1 Pre-procedure details:    Preparation:  Imaging obtained to evaluate for foreign bodies Exploration:    Limited defect created (wound extended): no     Imaging outcome: foreign body not noted     Wound exploration: wound explored through full range of motion and entire depth of wound visualized     Contaminated: no   Treatment:    Area cleansed with:  Chlorhexidine , saline and  Shur-Clens   Amount of cleaning:  Standard   Visualized foreign bodies/material removed: no     Debridement:  None   Undermining:  None Skin repair:    Repair method:  Tissue adhesive Approximation:    Approximation:  Close Repair type:    Repair type:  Simple Post-procedure details:    Dressing:  Open (no dressing)   Procedure completion:  Tolerated    Medications Ordered in the ED  Tdap (ADACEL ) injection 0.5 mL (0.5 mLs Intramuscular Given 01/19/24 1335)                                    Medical Decision Making Amount and/or Complexity of Data Reviewed Radiology: ordered.  Risk Prescription drug management.   See excerpt from my MSE note: Dailin AREYA LEMMERMAN is a 64 y.o. female with a past medical history significant for ulcerative colitis, osteoporosis, previous breast cancer, and hypothyroidism who presents for head trauma.  According to patient, she was trying to put the ladder back up for her attic when it popped back down and hit her in the head.  She reports no loss of consciousness but does have some headache.  She sustained a laceration or injury to her scalp that she felt bleeding.  She denied any other neurological complaints including no nausea, vomiting, numbness, tingling, weakness of extremities.  She denies any speech changes or vision changes.   Patient denies any other injuries and denies any neck pain or chest pain or back pain.  Patient just has a head injury.   On exam, patient has matted dried blood on her hair likely overlying a laceration or abrasion.  No neck tenderness.  No focal neurologic deficits.  Patient otherwise well-appearing.   Will get CT scan of the head to look for skull fracture and update her tetanus shot as she does not know the last time she had that.  Anticipate it will be cleaned up and washed out to see if it needs repair after imaging is completed.  3:37 PM Patient has now moved into her room after her CT scan is completed.  I went  through the findings with the  patient and her CT does not show skull fracture but does show evidence of remote small stroke.  We discussed this finding.  Will clean off her wound and determine if needs laceration repair.  If so we will anesthetize it and likely use staples if needed.  Will reassess and management.  4:08 PM After washing the wound it did not pull apart and stayed well-approximated.  It was about 1 cm.  We had a shared decision-making conversation and agreed to use skin adhesive instead of numbing it and stapling.  Patient agreed with this.  Due to the patient's dirty fingers and her wound and the dirty ladder that hit her, patient requested antibiotics we will give her a short prescription for Keflex  to treat for possible infection.  Patient agrees with plan for discharge home and her Tdap was updated.  Patient discharged in good condition for outpatient follow-up.     Final diagnoses:  Minor head injury, initial encounter  Laceration of scalp, initial encounter    ED Discharge Orders          Ordered    cephALEXin  (KEFLEX ) 500 MG capsule  4 times daily        01/19/24 1606            Clinical Impression: 1. Minor head injury, initial encounter   2. Laceration of scalp, initial encounter     Disposition: Discharge  Condition: Good  I have discussed the results, Dx and Tx plan with the pt(& family if present). He/she/they expressed understanding and agree(s) with the plan. Discharge instructions discussed at great length. Strict return precautions discussed and pt &/or family have verbalized understanding of the instructions. No further questions at time of discharge.    New Prescriptions   CEPHALEXIN  (KEFLEX ) 500 MG CAPSULE    Take 1 capsule (500 mg total) by mouth 4 (four) times daily for 7 days.    Follow Up: Loreli Elsie JONETTA Mickey., MD [address] [place] [address] [phone]     [facility] Emergency Department at [facility] [address] [place]  [address] [phone]         Avyukth Bontempo, Lonni PARAS, MD 01/19/24 1610  "

## 2024-01-19 NOTE — ED Triage Notes (Addendum)
 Pt to er via ems per ems pt got hit in the head with a ladder and has a lac to her head.  Pt states that she was folding up the attic ladder and it fell and hit her head.  Denies loc, pt awake and oriented times three bleeding is controlled at this time.

## 2024-01-19 NOTE — ED Provider Triage Note (Signed)
 Emergency Medicine Provider Triage Evaluation Note  Carly Cook , a 64 y.o. female  was evaluated in triage.  Pt complains of head injury.  Review of Systems  Positive: Scalp laceration after direct trauma.  Mild headache now. Negative: No double vision, blurry vision, speech changes, neurologic complaints.  No syncope or neck pain  Physical Exam  Ht 5' 4 (1.626 m)   Wt 61.2 kg   BMI 23.17 kg/m  Gen:   Awake, no distress   Resp:  Normal effort  MSK:   Moves extremities without difficulty  Other:  Matted hair with dried blood with likely laceration to her scalp.  Neck nontender.  No focal neurologic deficits initially.  Clear lungs  Medical Decision Making  Medically screening exam initiated at 1:11 PM.  Appropriate orders placed.  Carly Cook was informed that the remainder of the evaluation will be completed by another provider, this initial triage assessment does not replace that evaluation, and the importance of remaining in the ED until their evaluation is complete.  Carly Cook is a 64 y.o. female with a past medical history significant for ulcerative colitis, osteoporosis, previous breast cancer, and hypothyroidism who presents for head trauma.  According to patient, she was trying to put the ladder back up for her attic when it popped back down and hit her in the head.  She reports no loss of consciousness but does have some headache.  She sustained a laceration or injury to her scalp that she felt bleeding.  She denied any other neurological complaints including no nausea, vomiting, numbness, tingling, weakness of extremities.  She denies any speech changes or vision changes.  Patient denies any other injuries and denies any neck pain or chest pain or back pain.  Patient just has a head injury.  On exam, patient has matted dried blood on her hair likely overlying a laceration or abrasion.  No neck tenderness.  No focal neurologic deficits.  Patient otherwise  well-appearing.  Will get CT scan of the head to look for skull fracture and update her tetanus shot as she does not know the last time she had that.  Anticipate it will be cleaned up and washed out to see if it needs repair after imaging is completed.      Charly Holcomb, Lonni PARAS, MD 01/19/24 1315

## 2024-01-19 NOTE — Discharge Instructions (Signed)
 Your history, exam, and evaluation today are consistent with a minor head injury with small laceration on your scalp from getting hit by the attic ladder.  The CT scan did not show skull fracture or intracranial bleeding but it did show the incidental finding of likely old stroke.  Please consider follow-up with your primary doctor for further management of this.  We were able to use skin adhesive to close the wound as it did not separate on washout and exam.  We updated your tetanus shot today.  Please consider starting the antibiotic to prevent infection as it was a somewhat dirty wound that we cleaned.  Please rest and stay hydrated and follow-up with your primary doctor.  If any symptoms change or worsen acutely, return to the nearest emergency department.

## 2024-01-19 NOTE — ED Notes (Signed)
 Suture cart brought to bedside

## 2024-02-08 ENCOUNTER — Institutional Professional Consult (permissible substitution): Admitting: Neurology

## 2024-02-08 NOTE — Progress Notes (Unsigned)
 Chronic insomnia with  hx of  anxiety, RLS , Ulcerative colitis, breast cancer   Blunt head trauma 01-19-2024 , ladder struck head.  CT showed no trauma but small remote lacunar stroke left BGL/ per ED Dr Ginger .   No MRI yet
